# Patient Record
Sex: Female | Born: 1990 | Race: White | Hispanic: No | Marital: Married | Smoking: Former smoker
Health system: Southern US, Community
[De-identification: ages and names within clinical notes are randomized; demographics above are authoritative.]

## PROBLEM LIST (undated history)

## (undated) ENCOUNTER — Inpatient Hospital Stay (HOSPITAL_COMMUNITY): Payer: Self-pay

## (undated) DIAGNOSIS — T8859XA Other complications of anesthesia, initial encounter: Secondary | ICD-10-CM

## (undated) DIAGNOSIS — J45909 Unspecified asthma, uncomplicated: Secondary | ICD-10-CM

## (undated) DIAGNOSIS — R55 Syncope and collapse: Secondary | ICD-10-CM

## (undated) DIAGNOSIS — O09299 Supervision of pregnancy with other poor reproductive or obstetric history, unspecified trimester: Secondary | ICD-10-CM

## (undated) DIAGNOSIS — R569 Unspecified convulsions: Secondary | ICD-10-CM

## (undated) HISTORY — DX: Other complications of anesthesia, initial encounter: T88.59XA

## (undated) HISTORY — PX: ORIF CLAVICULAR FRACTURE: SHX5055

## (undated) HISTORY — DX: Syncope and collapse: R55

---
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
## 2017-11-02 ENCOUNTER — Ambulatory Visit (HOSPITAL_COMMUNITY)
Admission: EM | Admit: 2017-11-02 | Discharge: 2017-11-02 | Disposition: A | Discharge status: Home / Self Care | Payer: BLUE CROSS/BLUE SHIELD | Attending: Family Medicine | Admitting: Family Medicine

## 2017-11-02 ENCOUNTER — Encounter (HOSPITAL_COMMUNITY): Payer: Self-pay

## 2017-11-02 ENCOUNTER — Other Ambulatory Visit: Payer: Self-pay

## 2017-11-02 DIAGNOSIS — L03113 Cellulitis of right upper limb: Secondary | ICD-10-CM | POA: Diagnosis not present

## 2017-11-02 DIAGNOSIS — W57XXXA Bitten or stung by nonvenomous insect and other nonvenomous arthropods, initial encounter: Secondary | ICD-10-CM

## 2017-11-02 DIAGNOSIS — L03119 Cellulitis of unspecified part of limb: Secondary | ICD-10-CM

## 2017-11-02 DIAGNOSIS — S40861A Insect bite (nonvenomous) of right upper arm, initial encounter: Secondary | ICD-10-CM

## 2017-11-02 DIAGNOSIS — R21 Rash and other nonspecific skin eruption: Secondary | ICD-10-CM

## 2017-11-02 DIAGNOSIS — B998 Other infectious disease: Secondary | ICD-10-CM | POA: Insufficient documentation

## 2017-11-02 DIAGNOSIS — R569 Unspecified convulsions: Secondary | ICD-10-CM | POA: Insufficient documentation

## 2017-11-02 DIAGNOSIS — S50361A Insect bite (nonvenomous) of right elbow, initial encounter: Secondary | ICD-10-CM | POA: Insufficient documentation

## 2017-11-02 DIAGNOSIS — M7989 Other specified soft tissue disorders: Secondary | ICD-10-CM | POA: Diagnosis present

## 2017-11-02 DIAGNOSIS — J45909 Unspecified asthma, uncomplicated: Secondary | ICD-10-CM | POA: Diagnosis not present

## 2017-11-02 DIAGNOSIS — L02413 Cutaneous abscess of right upper limb: Secondary | ICD-10-CM | POA: Diagnosis not present

## 2017-11-02 DIAGNOSIS — M25522 Pain in left elbow: Secondary | ICD-10-CM | POA: Diagnosis not present

## 2017-11-02 DIAGNOSIS — L02419 Cutaneous abscess of limb, unspecified: Secondary | ICD-10-CM

## 2017-11-02 DIAGNOSIS — F17210 Nicotine dependence, cigarettes, uncomplicated: Secondary | ICD-10-CM | POA: Diagnosis not present

## 2017-11-02 HISTORY — DX: Unspecified asthma, uncomplicated: J45.909

## 2017-11-02 HISTORY — DX: Unspecified convulsions: R56.9

## 2017-11-02 MED ORDER — AMOXICILLIN 500 MG PO CAPS: 1000.0000 mg | ORAL_CAPSULE | Freq: Two times a day (BID) | ORAL | 0 refills | Status: DC

## 2017-11-02 MED ORDER — HYDROCODONE-ACETAMINOPHEN 5-325 MG PO TABS
1.0000 | ORAL_TABLET | Freq: Once | ORAL | Status: AC
  Administered 2017-11-02: 1 via ORAL

## 2017-11-02 MED ORDER — CEFTRIAXONE SODIUM 1 G IJ SOLR
1.0000 g | Freq: Once | INTRAMUSCULAR | Status: AC
  Administered 2017-11-02: 1 g via INTRAMUSCULAR

## 2017-11-02 MED ORDER — HYDROCODONE-ACETAMINOPHEN 5-325 MG PO TABS: 1.0000 | ORAL_TABLET | Freq: Four times a day (QID) | ORAL | 0 refills | Status: DC | PRN

## 2017-11-02 MED ORDER — HYDROCODONE-ACETAMINOPHEN 5-325 MG PO TABS
ORAL_TABLET | ORAL | Status: AC
  Filled 2017-11-02: qty 1

## 2017-11-02 MED ORDER — LIDOCAINE HCL (PF) 1 % IJ SOLN
INTRAMUSCULAR | Status: AC
  Filled 2017-11-02: qty 2

## 2017-11-02 MED ORDER — CEFTRIAXONE SODIUM 1 G IJ SOLR
INTRAMUSCULAR | Status: AC
  Filled 2017-11-02: qty 10

## 2017-11-02 MED ORDER — DOXYCYCLINE HYCLATE 100 MG PO CAPS: 100.0000 mg | ORAL_CAPSULE | Freq: Two times a day (BID) | ORAL | 0 refills | Status: AC

## 2017-11-02 NOTE — Discharge Instructions (Signed)
Take medication as prescribed.  Follow up with a health care provider in two days.  If you get worse then please go to the hospital.

## 2017-11-02 NOTE — ED Triage Notes (Signed)
Patient presents to [facility] for possible insect bite on left arm x5 days, pt's arm is red and swollen, pt has been taking OTC medications to treat pain but has no relief

## 2017-11-02 NOTE — ED Provider Notes (Signed)
11/02/2017 7:07 PM   DOB: [birth year] / MRN: [id number]  SUBJECTIVE:  Autumn Stephenson is a 27 y.o. female presenting for insect bite about the right elbow that is now red and inflamed. She complains of exquisite pain about the right elbow and complains of feeling feverish. Last TD was three years ago.   She has No Known Allergies.   She  has a past medical history of Asthma and Seizures (HCC).    She  reports that she has been smoking.  She has a 5.00 pack-year smoking history. She has never used smokeless tobacco. She reports that she drinks about 3.6 oz of alcohol per week. She reports that she does not use drugs. She  reports that she currently engages in sexual activity. The patient  has no past surgical history on file.  Her family history is not on file.  Review of Systems  Constitutional: Positive for fever. Negative for chills and diaphoresis.  Gastrointestinal: Negative for nausea.  Musculoskeletal: Positive for joint pain.  Skin: Positive for rash.  Neurological: Negative for dizziness.    OBJECTIVE:  BP 115/85 (BP Location: Right Arm)   Pulse (!) 101   Temp 99 F (37.2 C) (Oral)   Resp 18   LMP 10/28/2017 (Exact Date)   SpO2 100%   Wt Readings from Last 3 Encounters:  No data found for Wt   Temp Readings from Last 3 Encounters:  11/02/17 99 F (37.2 C) (Oral)   BP Readings from Last 3 Encounters:  11/02/17 115/85   Pulse Readings from Last 3 Encounters:  11/02/17 (!) 101    Physical Exam  Constitutional: She is oriented to person, place, and time. She appears well-nourished. No distress.  Eyes: Pupils are equal, round, and reactive to light. EOM are normal.  Cardiovascular: Regular rhythm.  Pulmonary/Chest: Effort normal.  Abdominal: She exhibits no distension.  Musculoskeletal:       Arms: Neurological: She is alert and oriented to person, place, and time. No cranial nerve deficit. Gait normal.  Skin: Skin is dry. She is not diaphoretic.  Psychiatric: She  has a normal mood and affect.  Vitals reviewed.  Risk and benefits discussed and verbal consent obtained. Anesthetic allergies reviewed. Patient anesthetized using 1:1 mix of 2% lidocaine with epi. A 1 cm incision was made using a number 11 blade and purulent material was expressed.  The was wound packed. The patient tolerated the procedure without difficulty.   A clean dressing was placed and wound care instructions were provided.    No results found for this or any previous visit (from the past 72 hour(s)).  No results found.  ASSESSMENT AND PLAN:   Cellulitis and abscess of upper extremity: Drained here.  The infection looks as if it is starting to take off.  I am giving her an IM dose of ceftiraxone and starting her on amox and doxy. Call pull off one when the culture results. Strict ED precautions provided.     Discharge Instructions     Take medication as prescribed.  Follow up with a health care provider in two days.  If you get worse then please go to the hospital.         The patient is advised to call or return to clinic if she does not see an improvement in symptoms, or to seek the care of the closest emergency department if she worsens with the above plan.   Deliah Boston, MHS, PA-C 11/02/2017 7:07 PM   Deliah Boston  L, PA-C 11/02/17 1910

## 2017-11-05 LAB — AEROBIC CULTURE W GRAM STAIN (SUPERFICIAL SPECIMEN): Special Requests: NORMAL

## 2017-11-05 LAB — AEROBIC CULTURE  (SUPERFICIAL SPECIMEN)

## 2017-11-08 ENCOUNTER — Telehealth (HOSPITAL_COMMUNITY): Payer: Self-pay

## 2017-11-08 NOTE — Telephone Encounter (Signed)
Culture results given to Dr. Georgia Duff, per his instruction patient called and instructed to stop Doxycycline and to keep the area clean and dry.  Pt reports feeling better and following up with her PCP

## 2017-11-19 NOTE — Congregational Nurse Program (Signed)
Congregational Nurse Program Note  Date of Encounter: 11/06/2017  Past Medical History: Past Medical History:  Diagnosis Date  . Asthma   . Seizures [facility](HCC)     Encounter Details: CNP Questionnaire - 11/12/17 0928      Questionnaire   Patient Status  Not Applicable    Race  White or Caucasian    Location Patient Served At  [facility]    Insurance  Not Applicable    Uninsured  Uninsured (NEW 1x/quarter)    Food  No food insecurities    Housing/Utilities  Yes, have permanent housing    Transportation  No transportation needs    Interpersonal Safety  Yes, feel physically and emotionally safe where you currently live    Medication  Yes, have medication insecurities    Medical Provider  No    Referrals  Area Agency    ED Visit Averted  Not Applicable    Life-Saving Intervention Made  Not Applicable      [facility] - supplies provided

## 2017-11-27 NOTE — Congregational Nurse Program (Signed)
Congregational Nurse Program Note  Date of Encounter: 11/26/2017  Past Medical History: Past Medical History:  Diagnosis Date  . Asthma   . Seizures (HCC)     Encounter Details: CNP Questionnaire - 11/26/17 1309      Questionnaire   Patient Status  Not Applicable    Race  White or Caucasian    [facility]    Insurance  Private Insurance    Uninsured  Not Applicable    Food  No food insecurities    Housing/Utilities  Yes, have permanent housing    Transportation  No transportation needs    Interpersonal Safety  Yes, feel physically and emotionally safe where you currently live    Medication  Yes, have medication insecurities    Medical Provider  No    Referrals  Area Agency    ED Visit Averted  Not Applicable    Life-Saving Intervention Made  Not Applicable      Discussed substance abuse treatment options

## 2017-12-14 NOTE — Congregational Nurse Program (Signed)
Congregational Nurse Program Note  Date of Encounter: 12/11/2017  Past Medical History: Past Medical History:  Diagnosis Date  . Asthma   . Seizures [facility](HCC)     Encounter Details: CNP Questionnaire - 12/11/17 1519      Questionnaire   Patient Status  Not Applicable    Race  White or Caucasian    [facility]    Insurance  Private Insurance    Uninsured  Not Applicable    Food  No food insecurities    Housing/Utilities  Yes, have permanent housing    Transportation  No transportation needs    Interpersonal Safety  Yes, feel physically and emotionally safe where you currently live    Medication  Yes, have medication insecurities    Medical Provider  No    Referrals  Area Agency    ED Visit Averted  Not Applicable    Life-Saving Intervention Made  Not Applicable      Client interested in treatment options for substance abuse.  Assisted client in researching options

## 2018-01-21 ENCOUNTER — Encounter: Payer: BLUE CROSS/BLUE SHIELD | Attending: Nurse Practitioner | Admitting: Nurse Practitioner

## 2018-01-21 DIAGNOSIS — L97111 Non-pressure chronic ulcer of right thigh limited to breakdown of skin: Secondary | ICD-10-CM | POA: Insufficient documentation

## 2018-01-21 DIAGNOSIS — L02415 Cutaneous abscess of right lower limb: Secondary | ICD-10-CM | POA: Insufficient documentation

## 2018-01-28 ENCOUNTER — Encounter: Payer: BLUE CROSS/BLUE SHIELD | Admitting: Nurse Practitioner

## 2018-01-28 DIAGNOSIS — L97111 Non-pressure chronic ulcer of right thigh limited to breakdown of skin: Secondary | ICD-10-CM | POA: Diagnosis not present

## 2018-01-31 NOTE — Progress Notes (Signed)
Autumn Stephenson, Divina ([id number]) Visit Report for 01/21/2018 Abuse/Suicide Risk Screen Details Patient Name: Autumn Stephenson, Autumn Stephenson Date of Service: 01/21/2018 9:45 AM Medical Record Number: [id number] Patient Account Number: [id number] Date of Birth/Sex: [birth year] (27 y.o. F) Treating RN: Renne CriglerFlinchum, Cheryl Primary Care Ferrel Simington: SYSTEM, Maurion Walkowiak Other Clinician: Referring Ramon Brant: Elray BubaWASHO, MICHAEL Treating Jenny Lai/Extender: Kathreen Cosieroulter, Leah Weeks in Treatment: 0 Abuse/Suicide Risk Screen Items Answer ABUSE/SUICIDE RISK SCREEN: Has anyone close to you tried to hurt or harm you recentlyo No Do you feel uncomfortable with anyone in your familyo No Has anyone forced you do things that you didnot want to doo No Patient displays signs or symptoms of abuse and/or neglect. No Electronic Signature(s) Signed: 01/21/2018 1:45:59 PM By: Renne CriglerFlinchum, Cheryl Entered By: Renne CriglerFlinchum, Cheryl on 01/21/2018 10:21:41 Autumn Stephenson, Autumn Stephenson ([id number]) -------------------------------------------------------------------------------- Activities of Daily Living Details Patient Name: Autumn Stephenson, Autumn Stephenson Date of Service: 01/21/2018 9:45 AM Medical Record Number: [id number] Patient Account Number: [id number] Date of Birth/Sex: [birth year] (27 y.o. F) Treating RN: Renne CriglerFlinchum, Cheryl Primary Care Janete Quilling: SYSTEM, Lala Been Other Clinician: Referring Jerrico Covello: Elray BubaWASHO, MICHAEL Treating Hetty Linhart/Extender: Kathreen Cosieroulter, Leah Weeks in Treatment: 0 Activities of Daily Living Items Answer Activities of Daily Living (Please select one for each item) Drive Automobile Completely Able Take Medications Completely Able Use Telephone Completely Able Care for Appearance Completely Able Use Toilet Completely Able Bath / Shower Completely Able Dress Self Completely Able Feed Self Completely Able Walk Completely Able Get In / Out Bed Completely Able Housework Completely Able Prepare Meals Completely Able Handle Money Completely Able Shop for Self Completely Able Electronic  Signature(s) Signed: 01/21/2018 1:45:59 PM By: Renne CriglerFlinchum, Cheryl Entered By: Renne CriglerFlinchum, Cheryl on 01/21/2018 10:22:01 Autumn Stephenson, Adriona ([id number]) -------------------------------------------------------------------------------- Education Assessment Details Patient Name: Autumn Stephenson, Autumn Stephenson Date of Service: 01/21/2018 9:45 AM Medical Record Number: [id number] Patient Account Number: [id number] Date of Birth/Sex: [birth year] (27 y.o. F) Treating RN: Renne CriglerFlinchum, Cheryl Primary Care Neal Oshea: SYSTEM, Victoria Euceda Other Clinician: Referring Rylee Nuzum: Elray BubaWASHO, MICHAEL Treating Anushree Dorsi/Extender: Kathreen Cosieroulter, Leah Weeks in Treatment: 0 Primary Learner Assessed: Patient Learning Preferences/Education Level/Primary Language Learning Preference: Explanation Highest Education Level: College or Above Preferred Language: English Cognitive Barrier Assessment/Beliefs Language Barrier: No Translator Needed: No Memory Deficit: No Emotional Barrier: No Cultural/Religious Beliefs Affecting Medical Care: No Physical Barrier Assessment Impaired Vision: No Impaired Hearing: No Decreased Hand dexterity: No Knowledge/Comprehension Assessment Knowledge Level: High Comprehension Level: High Ability to understand written High instructions: Ability to understand verbal High instructions: Motivation Assessment Anxiety Level: Calm Cooperation: Cooperative Education Importance: Acknowledges Need Interest in Health Problems: Asks Questions Perception: Coherent Willingness to Engage in Self- High Management Activities: Readiness to Engage in Self- High Management Activities: Electronic Signature(s) Signed: 01/21/2018 1:45:59 PM By: Renne CriglerFlinchum, Cheryl Entered By: Renne CriglerFlinchum, Cheryl on 01/21/2018 10:22:28 Autumn Stephenson, Autumn Stephenson ([id number]) -------------------------------------------------------------------------------- Fall Risk Assessment Details Patient Name: Autumn Stephenson, [name] Date of Service: 01/21/2018 9:45 AM Medical Record Number:  [id number] Patient Account Number: [id number] Date of Birth/Sex: [birth year] (27 y.o. F) Treating RN: Renne CriglerFlinchum, Cheryl Primary Care Reesha Debes: SYSTEM, Calixto Pavel Other Clinician: Referring Akram Kissick: Elray BubaWASHO, MICHAEL Treating Jessilynn Taft/Extender: Kathreen Cosieroulter, Leah Weeks in Treatment: 0 Fall Risk Assessment Items Have you had 2 or more falls in the last 12 monthso 0 No Have you had any fall that resulted in injury in the last 12 monthso 0 No FALL RISK ASSESSMENT: History of falling - immediate or within 3 months 0 No Secondary diagnosis 0 No Ambulatory aid None/bed rest/wheelchair/nurse 0 No Crutches/cane/walker 0 No Furniture 0 No IV Access/Saline Lock 0 No Gait/Training Normal/bed rest/immobile 0 No Weak 0 No Impaired 0 No Mental Status Oriented to  own ability 0 No Electronic Signature(s) Signed: 01/21/2018 1:45:59 PM By: Renne CriglerFlinchum, Cheryl Entered By: Renne CriglerFlinchum, Cheryl on 01/21/2018 10:22:34 Autumn Stephenson, Bhavya ([id number]) -------------------------------------------------------------------------------- Foot Assessment Details Patient Name: Autumn Stephenson, Autumn Stephenson Date of Service: 01/21/2018 9:45 AM Medical Record Number: [id number] Patient Account Number: [id number] Date of Birth/Sex: [birth year] (27 y.o. F) Treating RN: Renne CriglerFlinchum, Cheryl Primary Care Nani Ingram: SYSTEM, Kaytee Taliercio Other Clinician: Referring Sten Dematteo: Elray BubaWASHO, MICHAEL Treating Jaelee Laughter/Extender: Kathreen Cosieroulter, Leah Weeks in Treatment: 0 Foot Assessment Items Site Locations + = Sensation present, - = Sensation absent, C = Callus, U = Ulcer R = Redness, W = Warmth, M = Maceration, PU = Pre-ulcerative lesion F = Fissure, S = Swelling, D = Dryness Assessment Right: Left: Other Deformity: No No Prior Foot Ulcer: No No Prior Amputation: No No Charcot Joint: No No Ambulatory Status: Ambulatory Without Help Gait: Steady Electronic Signature(s) Signed: 01/21/2018 1:45:59 PM By: Renne CriglerFlinchum, Cheryl Entered By: Renne CriglerFlinchum, Cheryl on 01/21/2018 10:24:15 Autumn Stephenson, Cayleen  ([id number]) -------------------------------------------------------------------------------- Nutrition Risk Assessment Details Patient Name: Autumn Stephenson, Rashaun Date of Service: 01/21/2018 9:45 AM Medical Record Number: [id number] Patient Account Number: [id number] Date of Birth/Sex: [birth year] (27 y.o. F) Treating RN: Renne CriglerFlinchum, Cheryl Primary Care Saira Kramme: SYSTEM, Asha Grumbine Other Clinician: Referring Pavle Wiler: Elray BubaWASHO, MICHAEL Treating Kamen Hanken/Extender: Kathreen Cosieroulter, Leah Weeks in Treatment: 0 Height (in): 63 Weight (lbs): 117 Body Mass Index (BMI): 20.7 Nutrition Risk Assessment Items NUTRITION RISK SCREEN: I have an illness or condition that made me change the kind and/or amount of 0 No food I eat I eat fewer than two meals per day 0 No I eat few fruits and vegetables, or milk products 0 No I have three or more drinks of beer, liquor or wine almost every day 0 No I have tooth or mouth problems that make it hard for me to eat 0 No I don't always have enough money to buy the food I need 0 No I eat alone most of the time 0 No I take three or more different prescribed or over-the-counter drugs a day 0 No Without wanting to, I have lost or gained 10 pounds in the last six months 0 No I am not always physically able to shop, cook and/or feed myself 0 No Nutrition Protocols Good Risk Protocol 0 No interventions needed Moderate Risk Protocol Electronic Signature(s) Signed: 01/21/2018 1:45:59 PM By: Renne CriglerFlinchum, Cheryl Entered By: Renne CriglerFlinchum, Cheryl on 01/21/2018 10:23:01

## 2018-02-01 NOTE — Progress Notes (Signed)
Autumn Stephenson, Autumn Stephenson ([id number]) Visit Report for 01/21/2018 Chief Complaint Document Details Patient Name: Autumn Stephenson, Autumn Stephenson Date of Service: 01/21/2018 9:45 AM Medical Record Number: [id number] Patient Account Number: [id number] Date of Birth/Sex: Feb 20, 1991 (26 y.o. F) Treating RN: Huel CoventryWoody, Kim Primary Care Provider: SYSTEM, PROVIDER Other Clinician: Referring Provider: Elray BubaWASHO, MICHAEL Treating Provider/Extender: Kathreen Cosieroulter, [name] Weeks in Treatment: 0 Information Obtained from: Patient Chief Complaint right thigh wound Electronic Signature(s) Signed: 01/21/2018 10:36:48 AM By: Bonnell Publicoulter, [name] Entered By: Bonnell Publicoulter, [name] on 01/21/2018 10:36:48 Autumn Stephenson, Autumn Stephenson ([id number]) -------------------------------------------------------------------------------- HPI Details Patient Name: Autumn Stephenson, Autumn Stephenson Date of Service: 01/21/2018 9:45 AM Medical Record Number: [id number] Patient Account Number: [id number] Date of Birth/Sex: Feb 20, 1991 (26 y.o. F) Treating RN: Huel CoventryWoody, Kim Primary Care Provider: SYSTEM, PROVIDER Other Clinician: Referring Provider: Elray BubaWASHO, MICHAEL Treating Provider/Extender: Kathreen Cosieroulter, [name] Weeks in Treatment: 0 History of Present Illness HPI Description: 01/21/18-She is here initial evaluation for a right lateral thigh wound. She states she had an abscess, was using warm compress and it spontaneously ruptured 3-4 weeks ago. She has currently been applying mupirocin on daily. She has had 2 separate cycles of seven-day courses of Augmentin and doxycycline. There is no evidence of infection at today's visit. We will initiate hydrofera blue and she will follow up next week. She is voicing no c/o pain Electronic Signature(s) Signed: 01/21/2018 10:44:43 AM By: Bonnell Publicoulter, [name] Previous Signature: 01/21/2018 10:43:39 AM Version By: Bonnell Publicoulter, [name] Entered By: Bonnell Publicoulter, [name] on 01/21/2018 10:44:43 Autumn Stephenson, Autumn Stephenson ([id number]) -------------------------------------------------------------------------------- Physical Exam Details Patient  Name: Autumn Stephenson, Autumn Stephenson Date of Service: 01/21/2018 9:45 AM Medical Record Number: [id number] Patient Account Number: [id number] Date of Birth/Sex: Feb 20, 1991 (26 y.o. F) Treating RN: Huel CoventryWoody, Kim Primary Care Provider: SYSTEM, PROVIDER Other Clinician: Referring Provider: Elray BubaWASHO, MICHAEL Treating Provider/Extender: Kathreen Cosieroulter, [name] Weeks in Treatment: 0 Respiratory respirations are even and unlabored. clear throughout. Cardiovascular s1 s2 regular rate and rhythm. Musculoskeletal ambulates with no assistive devices. Psychiatric appears to have appropriate insight and judgement to medical care. oriented x4. calm, pleasant, conversive. Electronic Signature(s) Signed: 01/21/2018 10:45:37 AM By: Bonnell Publicoulter, [name] Entered By: Bonnell Publicoulter, [name] on 01/21/2018 10:45:36 Autumn Stephenson, Autumn Stephenson ([id number]) -------------------------------------------------------------------------------- Physician Orders Details Patient Name: Autumn Stephenson, Autumn Stephenson Date of Service: 01/21/2018 9:45 AM Medical Record Number: [id number] Patient Account Number: [id number] Date of Birth/Sex: Feb 20, 1991 (26 y.o. 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 Primary Wound Dressing Wound #1 Right,Lateral Upper Leg o Hydrafera Blue Ready Transfer Secondary Dressing Wound #1 Right,Lateral Upper Leg o [place] Dressing Change Frequency Wound #1 Right,Lateral Upper Leg o Change Dressing Monday, Wednesday,  Friday Follow-up Appointments Wound #1 Right,Lateral Upper Leg o Return Appointment in 1 week. Electronic Signature(s) Signed: 01/21/2018 4:31:15 PM By: Bonnell Publicoulter, [name] Signed: 01/21/2018 5:11:54 PM By: Elliot GurneyWoody, BSN, RN, CWS, [name], BSN Entered By: Elliot GurneyWoody, BSN, RN, CWS, Kim on 01/21/2018 10:41:14 Autumn Stephenson, Autumn Stephenson ([id number]) -------------------------------------------------------------------------------- Problem List Details Patient Name: Autumn Stephenson, Autumn Stephenson Date of Service: 01/21/2018 9:45 AM Medical Record Number: [id number] Patient Account Number: [id number] Date of Birth/Sex: Feb 20, 1991 (26 y.o. 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 01/21/2018 No Yes of skin L02.415 Cutaneous abscess of right lower limb 01/21/2018 No Yes Inactive Problems Resolved Problems Electronic Signature(s) Signed: 01/21/2018 10:44:02 AM By: Bonnell Publicoulter, [name] Previous Signature: 01/21/2018 10:36:01 AM Version By: Bonnell Publicoulter, [name] Entered By: Bonnell Publicoulter, Mikiah Durall on 01/21/2018 10:44:01 Autumn Stephenson, Autumn Stephenson ([id number]) -------------------------------------------------------------------------------- Progress Note Details Patient Name: Autumn Stephenson, Autumn Stephenson Date of Service: 01/21/2018 9:45 AM Medical Record Number: [id number] Patient Account Number: [id number] Date of Birth/Sex: 1991/02/10 (26 y.o. F) Treating RN: Huel CoventryWoody, Kim Primary Care Provider: SYSTEM, PROVIDER Other Clinician: Referring Provider: Elray BubaWASHO, MICHAEL Treating Provider/Extender: Kathreen Cosieroulter, Dametrius Sanjuan Weeks in Treatment: 0 Subjective Chief Complaint Information obtained from Patient right thigh wound History of Present Illness (HPI) 01/21/18-She is here initial evaluation for a right lateral thigh wound.  She states she had an abscess, was using warm compress and it spontaneously ruptured 3-4 weeks ago. She has currently been applying mupirocin on daily. She has had 2 separate cycles of seven-day courses of Augmentin and doxycycline. There is no evidence of infection at today's visit. We will initiate hydrofera blue and she will follow up next week. She is voicing no c/o pain Wound History Patient presents with 1 open wound that has been present for approximately 1 .5 months. Patient has been treating wound in the following manner: mupericon. Laboratory tests have not been performed in the last month. Patient reportedly has not tested positive for an antibiotic resistant organism. Patient reportedly has not tested positive for osteomyelitis. Patient reportedly has not had testing performed to evaluate circulation in the legs. Patient experiences the following problems associated with their wounds: infection. Patient History Information obtained from Patient. Allergies No Known Drug Allergies Family History Cancer - Mother,Maternal Grandparents, Diabetes - Father, No family history of Heart Disease, Hypertension, Kidney Disease, Lung Disease, Seizures, Stroke, Thyroid Problems, Tuberculosis. Social History Current every day smoker, Marital Status - Single, Alcohol Use - Never, Drug Use - Prior History, Caffeine Use - Daily. Medical History Eyes Denies history of Cataracts, Glaucoma, Optic Neuritis Ear/Nose/Mouth/Throat Denies history of Chronic sinus problems/congestion, Middle ear problems Hematologic/Lymphatic Denies history of Anemia, Hemophilia, Human Immunodeficiency Virus, Lymphedema, Sickle Cell Disease Respiratory Patient has history of Asthma Denies history of Aspiration, Chronic Obstructive Pulmonary Disease (COPD), Pneumothorax, Sleep Apnea Cardiovascular Denies history of Angina, Arrhythmia, Congestive Heart Failure, Coronary Artery Disease, Deep Vein  Thrombosis, Hypertension, Hypotension, Myocardial Infarction, Peripheral Arterial Disease, Peripheral Venous Disease, Phlebitis, MASON, Maurice MarchLANE ([id number]) Vasculitis Gastrointestinal Denies history of Cirrhosis , Colitis, Crohn s, Hepatitis A, Hepatitis B, Hepatitis C Endocrine Denies history of Type I Diabetes, Type II Diabetes Genitourinary Denies history of End Stage Renal Disease Immunological Denies history of Lupus Erythematosus, Raynaud s, Scleroderma Integumentary (Skin) Denies history of History of Burn, History of pressure wounds Musculoskeletal Denies history of Gout, Rheumatoid Arthritis, Osteoarthritis, Osteomyelitis Neurologic Denies history of Dementia, Neuropathy, Quadriplegia, Paraplegia, Seizure Disorder Medical And Surgical History Notes Ear/Nose/Mouth/Throat deviated septum Review of Systems (ROS) Constitutional Symptoms (General Health) Denies complaints or symptoms of Fatigue, Fever, Chills, Marked Weight Change. Eyes Denies complaints or symptoms of Dry Eyes, Vision Changes, Glasses / Contacts. Ear/Nose/Mouth/Throat Denies complaints or symptoms of Difficult clearing ears, Sinusitis. Hematologic/Lymphatic Denies complaints or symptoms of Bleeding / Clotting Disorders, Human Immunodeficiency Virus. Respiratory Denies complaints or symptoms of Chronic or frequent coughs, Shortness of Breath. Cardiovascular Denies complaints or symptoms of Chest pain, LE  edema. Gastrointestinal Denies complaints or symptoms of Frequent diarrhea, Nausea, Vomiting, gastritis Endocrine Complains or has symptoms of Thyroid disease - hyperactive. Denies complaints or symptoms of Hepatitis, Polydypsia (Excessive Thirst). Genitourinary Denies complaints or symptoms of Kidney failure/ Dialysis, Incontinence/dribbling, kidney low funtion Immunological Denies complaints or symptoms of Hives, Itching. Integumentary (Skin) Complains or has symptoms of Wounds, Bleeding or bruising  tendency. Denies complaints or symptoms of Breakdown, Swelling. Musculoskeletal Denies complaints or symptoms of Muscle Pain, Muscle Weakness. Neurologic Denies complaints or symptoms of Numbness/parasthesias, Focal/Weakness. Oncologic The patient has no complaints or symptoms. LAIANA, FRATUS ([id number]) Objective Constitutional Vitals Time Taken: 10:13 AM, Height: 63 in, Source: Stated, Weight: 117 lbs, Source: Measured, BMI: 20.7, Temperature: 98.6 F, Pulse: 95 bpm, Respiratory Rate: 18 breaths/min, Blood Pressure: 114/82 mmHg. Respiratory respirations are even and unlabored. clear throughout. Cardiovascular s1 s2 regular rate and rhythm. Musculoskeletal ambulates with no assistive devices. Psychiatric appears to have appropriate insight and judgement to medical care. oriented x4. calm, pleasant, conversive. Integumentary (Hair, Skin) Wound #1 status is Open. Original cause of wound was Gradually Appeared. The wound is located on the Right,Lateral Upper Leg. The wound measures 1.5cm length x 1.3cm width x 0.2cm depth; 1.532cm^2 area and 0.306cm^3 volume. There is Fat Layer (Subcutaneous Tissue) Exposed exposed. There is no undermining noted, however, there is tunneling at 2:00 with a maximum distance of 0.2cm. There is a large amount of serous drainage noted. The wound margin is distinct with the outline attached to the wound base. There is large (67-100%) red, pink granulation within the wound bed. There is a small (1-33%) amount of necrotic tissue within the wound bed. The periwound skin appearance exhibited: Scarring. The periwound skin appearance did not exhibit: Callus, Crepitus, Excoriation, Induration, Rash, Dry/Scaly, Maceration, Atrophie Blanche, Cyanosis, Ecchymosis, Hemosiderin Staining, Mottled, Pallor, Rubor, Erythema. Periwound temperature was noted as No Abnormality. The periwound has tenderness on palpation. Assessment Active Problems ICD-10 Non-pressure chronic  ulcer of right thigh limited to breakdown of skin Cutaneous abscess of right lower limb Plan Wound Cleansing: Wound #1 Right,Lateral Upper Leg: Cleanse wound with mild soap and water Anesthetic (add to Medication List): Topical Lidocaine 4% cream applied to wound bed prior to debridement (In Clinic Only). Primary Wound Dressing: Wound #1 Right,Lateral Upper Leg: CAEDYN, TASSINARI ([id number]) Hydrafera Blue Ready Transfer Secondary Dressing: Wound #1 Right,Lateral Upper Leg: [place] Dressing Change Frequency: Wound #1 Right,Lateral Upper Leg: Change Dressing Monday, Wednesday, Friday Follow-up Appointments: Wound #1 Right,Lateral Upper Leg: Return Appointment in 1 week. Electronic Signature(s) Signed: 01/21/2018 11:56:59 AM By: Bonnell Public Entered By: Bonnell Public on 01/21/2018 11:56:59 Autumn Stephenson ([id number]) -------------------------------------------------------------------------------- ROS/PFSH Details Patient Name: Autumn Stephenson Date of Service: 01/21/2018 9:45 AM Medical Record Number: [id number] Patient Account Number: [id number] Date of Birth/Sex: 1991-06-01 (26 y.o. F) Treating RN: Renne Crigler Primary Care Provider: SYSTEM, PROVIDER Other Clinician: Referring Provider: Elray Buba Treating Provider/Extender: Kathreen Cosier in Treatment: 0 Information Obtained From Patient Wound History Do you currently have one or more open woundso Yes How many open wounds do you currently haveo 1 Approximately how long have you had your woundso 1 .5 months How have you been treating your wound(s) until nowo mupericon Has your wound(s) ever healed and then re-openedo No Have you had any lab work done in the past montho No Have you tested positive for an antibiotic resistant organism (MRSA, VRE)o No Have you tested positive for osteomyelitis (bone infection)o No Have you had any tests for circulation on your legso No Have  you had other problems associated with your  woundso Infection Constitutional Symptoms (General Health) Complaints and Symptoms: Negative for: Fatigue; Fever; Chills; Marked Weight Change Eyes Complaints and Symptoms: Negative for: Dry Eyes; Vision Changes; Glasses / Contacts Medical History: Negative for: Cataracts; Glaucoma; Optic Neuritis Ear/Nose/Mouth/Throat Complaints and Symptoms: Negative for: Difficult clearing ears; Sinusitis Medical History: Negative for: Chronic sinus problems/congestion; Middle ear problems Past Medical History Notes: deviated septum Hematologic/Lymphatic Complaints and Symptoms: Negative for: Bleeding / Clotting Disorders; Human Immunodeficiency Virus Medical History: Negative for: Anemia; Hemophilia; Human Immunodeficiency Virus; Lymphedema; Sickle Cell Disease Respiratory Complaints and Symptoms: Negative for: Chronic or frequent coughs; Shortness of Breath BLESSINGS, IN[place]TT ([id number]) Medical History: Positive for: Asthma Negative for: Aspiration; Chronic Obstructive Pulmonary Disease (COPD); Pneumothorax; Sleep Apnea Cardiovascular Complaints and Symptoms: Negative for: Chest pain; LE edema Medical History: Negative for: Angina; Arrhythmia; Congestive Heart Failure; Coronary Artery Disease; Deep Vein Thrombosis; Hypertension; Hypotension; Myocardial Infarction; Peripheral Arterial Disease; Peripheral Venous Disease; Phlebitis; Vasculitis Gastrointestinal Complaints and Symptoms: Negative for: Frequent diarrhea; Nausea; Vomiting Review of System Notes: gastritis Medical History: Negative for: Cirrhosis ; Colitis; Crohnos; Hepatitis A; Hepatitis B; Hepatitis C Endocrine Complaints and Symptoms: Positive for: Thyroid disease - hyperactive Negative for: Hepatitis; Polydypsia (Excessive Thirst) Medical History: Negative for: Type I Diabetes; Type II Diabetes Genitourinary Complaints and Symptoms: Negative for: Kidney failure/ Dialysis; Incontinence/dribbling Review of System  Notes: kidney low funtion Medical History: Negative for: End Stage Renal Disease Immunological Complaints and Symptoms: Negative for: Hives; Itching Medical History: Negative for: Lupus Erythematosus; Raynaudos; Scleroderma Integumentary (Skin) Complaints and Symptoms: Positive for: Wounds; Bleeding or bruising tendency Negative for: Breakdown; Swelling Medical History: Negative for: History of Burn; History of pressure wounds JAMILYA, SARRAZIN ([id number]) Musculoskeletal Complaints and Symptoms: Negative for: Muscle Pain; Muscle Weakness Medical History: Negative for: Gout; Rheumatoid Arthritis; Osteoarthritis; Osteomyelitis Neurologic Complaints and Symptoms: Negative for: Numbness/parasthesias; Focal/Weakness Medical History: Negative for: Dementia; Neuropathy; Quadriplegia; Paraplegia; Seizure Disorder Oncologic Complaints and Symptoms: No Complaints or Symptoms Immunizations Pneumococcal Vaccine: Received Pneumococcal Vaccination: Yes Implantable Devices Family and Social History Cancer: Yes - Mother,Maternal Grandparents; Diabetes: Yes - Father; Heart Disease: No; Hypertension: No; Kidney Disease: No; Lung Disease: No; Seizures: No; Stroke: No; Thyroid Problems: No; Tuberculosis: No; Current every day smoker; Marital Status - Single; Alcohol Use: Never; Drug Use: Prior History; Caffeine Use: Daily; Financial Concerns: No; Food, Clothing or Shelter Needs: No; Support System Lacking: No; Transportation Concerns: No; Advanced Directives: No; Patient does not want information on Advanced Directives; Living Will: No; Medical Power of Attorney: No Electronic Signature(s) Signed: 01/21/2018 1:45:59 PM By: Renne Crigler Signed: 01/21/2018 4:31:15 PM By: Bonnell Public Entered By: Renne Crigler on 01/21/2018 10:21:21 LOUETTA, HOLLINGSHEAD ([id number]) -------------------------------------------------------------------------------- SuperBill Details Patient Name: Autumn Stephenson Date of  Service: 01/21/2018 Medical Record Number: [id number] Patient Account Number: [id number] Date of Birth/Sex: 01/15/1991 (26 y.o. 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

## 2018-02-01 NOTE — Progress Notes (Signed)
MISHAAL, LANSDALE ([id number]) Visit Report for 01/21/2018 Allergy List Details Patient Name: Autumn Stephenson, Autumn Stephenson Date of Service: 01/21/2018 9:45 AM Medical Record Number: [id number] Patient Account Number: [id number] Date of Birth/Sex: [birth year] (27 y.o. F) Treating RN: Renne Crigler Primary Care Kylor Valverde: SYSTEM, Niasia Lanphear Other Clinician: Referring Jayvin Hurrell: Elray Buba Treating Jori Frerichs/Extender: Bonnell Public Weeks in Treatment: 0 Allergies Active Allergies No Known Drug Allergies Allergy Notes Electronic Signature(s) Signed: 01/21/2018 1:45:59 PM By: Renne Crigler Entered By: Renne Crigler on 01/21/2018 10:14:29 Autumn Stephenson ([id number]) -------------------------------------------------------------------------------- Arrival Information Details Patient Name: Autumn Stephenson Date of Service: 01/21/2018 9:45 AM Medical Record Number: [id number] Patient Account Number: [id number] Date of Birth/Sex: [birth year] (27 y.o. F) Treating RN: Renne Crigler Primary Care Annelies Coyt: SYSTEM, Anna-Marie Coller Other Clinician: Referring Homer Miller: Elray Buba Treating Azizah Lisle/Extender: Kathreen Cosier in Treatment: 0 Visit Information Patient Arrived: Ambulatory Arrival Time: 10:10 Accompanied By: self Transfer Assistance: None Patient Identification Verified: Yes Secondary Verification Process Completed: Yes Electronic Signature(s) Signed: 01/21/2018 1:45:59 PM By: Renne Crigler Entered By: Renne Crigler on 01/21/2018 10:13:12 Autumn Stephenson ([id number]) -------------------------------------------------------------------------------- Clinic Level of Care Assessment Details Patient Name: Autumn Stephenson Date of Service: 01/21/2018 9:45 AM Medical Record Number: [id number] Patient Account Number: [id number] Date of Birth/Sex: [birth year] (27 y.o. F) Treating RN: Huel Coventry Primary Care Takyia Sindt: SYSTEM, Debbe Crumble Other Clinician: Referring Revere Maahs: Elray Buba Treating Eriq Hufford/Extender: Kathreen Cosier in Treatment: 0 Clinic Level of Care Assessment Items TOOL 2 Quantity Score []  - Use when only an EandM is performed on the INITIAL visit 0 ASSESSMENTS - Nursing Assessment / Reassessment X - General Physical Exam (combine w/ comprehensive assessment (listed just below) when 1 20 performed on new pt. evals) X- 1 25 Comprehensive Assessment (HX, ROS, Risk Assessments, Wounds Hx, etc.) ASSESSMENTS - Wound and Skin Assessment / Reassessment X - Simple Wound Assessment / Reassessment - one wound 1 5 []  - 0 Complex Wound Assessment / Reassessment - multiple wounds []  - 0 Dermatologic / Skin Assessment (not related to wound area) ASSESSMENTS - Ostomy and/or Continence Assessment and Care []  - Incontinence Assessment and Management 0 []  - 0 Ostomy Care Assessment and Management (repouching, etc.) PROCESS - Coordination of Care X - Simple Patient / Family Education for ongoing care 1 15 []  - 0 Complex (extensive) Patient / Family Education for ongoing care X- 1 10 Staff obtains Chiropractor, Records, Test Results / Process Orders []  - 0 Staff telephones HHA, Nursing Homes / Clarify orders / etc []  - 0 Routine Transfer to another Facility (non-emergent condition) []  - 0 Routine Hospital Admission (non-emergent condition) []  - 0 New Admissions / Manufacturing engineer / Ordering NPWT, Apligraf, etc. []  - 0 Emergency Hospital Admission (emergent condition) X- 1 10 Simple Discharge Coordination []  - 0 Complex (extensive) Discharge Coordination PROCESS - Special Needs []  - Pediatric / Minor Patient Management 0 []  - 0 Isolation Patient Management NAIMAH, YINGST ([id number]) []  - 0 Hearing / Language / Visual special needs []  - 0 Assessment of Community assistance (transportation, D/C planning, etc.) []  - 0 Additional assistance / Altered mentation []  - 0 Support Surface(s) Assessment (bed, cushion, seat, etc.) INTERVENTIONS - Wound Cleansing / Measurement X - Wound  Imaging (photographs - any number of wounds) 1 5 []  - 0 Wound Tracing (instead of photographs) X- 1 5 Simple Wound Measurement - one wound []  - 0 Complex Wound Measurement - multiple wounds X- 1 5 Simple Wound Cleansing - one wound []  - 0 Complex Wound Cleansing - multiple wounds INTERVENTIONS - Wound Dressings []  -  Small Wound Dressing one or multiple wounds 0 X- 1 15 Medium Wound Dressing one or multiple wounds []  - 0 Large Wound Dressing one or multiple wounds []  - 0 Application of Medications - injection INTERVENTIONS - Miscellaneous []  - External ear exam 0 []  - 0 Specimen Collection (cultures, biopsies, blood, body fluids, etc.) []  - 0 Specimen(s) / Culture(s) sent or taken to Lab for analysis []  - 0 Patient Transfer (multiple staff / Nurse, adultHoyer Lift / Similar devices) []  - 0 Simple Staple / Suture removal (25 or less) []  - 0 Complex Staple / Suture removal (26 or more) []  - 0 Hypo / Hyperglycemic Management (close monitor of Blood Glucose) []  - 0 Ankle / Brachial Index (ABI) - do not check if billed separately Has the patient been seen at the hospital within the last three years: Yes Total Score: 115 Level Of Care: New/Established - Level 3 Electronic Signature(s) Signed: 01/21/2018 5:11:54 PM By: Elliot GurneyWoody, BSN, RN, CWS, [name], BSN Entered By: Elliot GurneyWoody, BSN, RN, CWS, Kim on 01/21/2018 10:42:09 Autumn Stephenson, Autumn Stephenson ([id number]) -------------------------------------------------------------------------------- Encounter Discharge Information Details Patient Name: Autumn Stephenson, Autumn Stephenson Date of Service: 01/21/2018 9:45 AM Medical Record Number: [id number] Patient Account Number: [id number] Date of Birth/Sex: 03-22-1991 (27 y.o. F) Treating RN: Renne CriglerFlinchum, Cheryl Primary Care Artasia Thang: SYSTEM, Letonia Stead Other Clinician: Referring Sherilyn Windhorst: Elray BubaWASHO, MICHAEL Treating Arkel Cartwright/Extender: Kathreen Cosieroulter, Leah Weeks in Treatment: 0 Encounter Discharge Information Items Discharge Condition: Stable Ambulatory  Status: Ambulatory Discharge Destination: Home Transportation: Private Auto Schedule Follow-up Appointment: Yes Clinical Summary of Care: Electronic Signature(s) Signed: 01/21/2018 1:45:59 PM By: Renne CriglerFlinchum, Cheryl Entered By: Renne CriglerFlinchum, Cheryl on 01/21/2018 10:45:37 Autumn Stephenson, Autumn Stephenson ([id number]) -------------------------------------------------------------------------------- Lower Extremity Assessment Details Patient Name: Autumn Stephenson, Autumn Stephenson Date of Service: 01/21/2018 9:45 AM Medical Record Number: [id number] Patient Account Number: [id number] Date of Birth/Sex: 03-22-1991 (27 y.o. F) Treating RN: Renne CriglerFlinchum, Cheryl Primary Care Fleur Audino: SYSTEM, Macarthur Lorusso Other Clinician: Referring Meilani Edmundson: Elray BubaWASHO, MICHAEL Treating Lunah Losasso/Extender: Kathreen Cosieroulter, Leah Weeks in Treatment: 0 Edema Assessment Assessed: [Left: No] [Right: No] Edema: [Left: N] [Right: o] Vascular Assessment Claudication: Claudication Assessment [Right:None] Pulses: Dorsalis Pedis Palpable: [Right:Yes] Posterior Tibial Extremity colors, hair growth, and conditions: Extremity Color: [Right:Normal] Hair Growth on Extremity: [Right:Yes] Temperature of Extremity: [Right:Warm] Capillary Refill: [Right:< 3 seconds] Toe Nail Assessment Left: Right: Thick: No Discolored: No Deformed: No Improper Length and Hygiene: No Electronic Signature(s) Signed: 01/21/2018 1:45:59 PM By: Renne CriglerFlinchum, Cheryl Entered By: Renne CriglerFlinchum, Cheryl on 01/21/2018 10:30:00 Autumn Stephenson, Autumn Stephenson ([id number]) -------------------------------------------------------------------------------- Multi Wound Chart Details Patient Name: Autumn Stephenson, Autumn Stephenson Date of Service: 01/21/2018 9:45 AM Medical Record Number: [id number] Patient Account Number: [id number] Date of Birth/Sex: 03-22-1991 (27 y.o. F) Treating RN: Huel CoventryWoody, Kim Primary Care Jaylynn Mcaleer: SYSTEM, Julie Paolini Other Clinician: Referring Jahquez Steffler: Elray BubaWASHO, MICHAEL Treating Kadeidra Coryell/Extender: Kathreen Cosieroulter, Leah Weeks in Treatment: 0 Vital  Signs Height(in): 63 Pulse(bpm): 95 Weight(lbs): 117 Blood Pressure(mmHg): 114/82 Body Mass Index(BMI): 21 Temperature(F): 98.6 Respiratory Rate 18 (breaths/min): Photos: [1:No Photos] [N/A:N/A] Wound Location: [1:Right Upper Leg - Lateral] [N/A:N/A] Wounding Event: [1:Gradually Appeared] [N/A:N/A] Primary Etiology: [1:To be determined] [N/A:N/A] Comorbid History: [1:Asthma] [N/A:N/A] Date Acquired: [1:12/15/2017] [N/A:N/A] Weeks of Treatment: [1:0] [N/A:N/A] Wound Status: [1:Open] [N/A:N/A] Measurements L x W x D [1:1.5x1.3x0.2] [N/A:N/A] (cm) Area (cm) : [1:1.532] [N/A:N/A] Volume (cm) : [1:0.306] [N/A:N/A] Position 1 (o'clock): [1:2] Maximum Distance 1 (cm): [1:0.2] Tunneling: [1:Yes] [N/A:N/A] Classification: [1:Full Thickness Without Exposed Support Structures] [N/A:N/A] Exudate Amount: [1:Large] [N/A:N/A] Exudate Type: [1:Serous] [N/A:N/A] Exudate Color: [1:amber] [N/A:N/A] Wound Margin: [1:Distinct, outline attached] [N/A:N/A] Granulation Amount: [1:Large (67-100%)] [N/A:N/A] Granulation Quality: [1:Red, Pink] [N/A:N/A] Necrotic Amount: [1:Small (  1-33%)] [N/A:N/A] Exposed Structures: [1:Fat Layer (Subcutaneous Tissue) Exposed: Yes] [N/A:N/A] Epithelialization: [1:Small (1-33%)] [N/A:N/A] Periwound Skin Texture: [1:Scarring: Yes Excoriation: No Induration: No Callus: No Crepitus: No Rash: No] [N/A:N/A] Periwound Skin Moisture: [1:Maceration: No Dry/Scaly: No] [N/A:N/A] Periwound Skin Color: [1:Atrophie Blanche: No Cyanosis: No] [N/A:N/A] Ecchymosis: No Erythema: No Hemosiderin Staining: No Mottled: No Pallor: No Rubor: No Temperature: No Abnormality N/A N/A Tenderness on Palpation: Yes N/A N/A Wound Preparation: Ulcer Cleansing: N/A N/A Rinsed/Irrigated with Saline Topical Anesthetic Applied: Other: LIDOCAINE 4% Treatment Notes Electronic Signature(s) Signed: 01/21/2018 10:44:07 AM By: Bonnell Publicoulter, Leah Previous Signature: 01/21/2018 10:36:34 AM Version By:  Bonnell Publicoulter, Leah Entered By: Bonnell Publicoulter, Leah on 01/21/2018 10:44:07 Autumn Stephenson, Shigeko ([id number]) -------------------------------------------------------------------------------- Multi-Disciplinary Care Plan Details Patient Name: Autumn Stephenson, Autumn Stephenson Date of Service: 01/21/2018 9:45 AM Medical Record Number: [id number] Patient Account Number: [id number] Date of Birth/Sex: [birth year] (26 y.o. F) Treating RN: Huel CoventryWoody, Kim Primary Care Manford Sprong: SYSTEM, Chadric Kimberley Other Clinician: Referring Maury Groninger: Elray BubaWASHO, MICHAEL Treating Veniamin Kincaid/Extender: Kathreen Cosieroulter, Leah Weeks in Treatment: 0 Active Inactive ` Orientation to the Wound Care Program Nursing Diagnoses: Knowledge deficit related to the wound healing center program Goals: Patient/caregiver will verbalize understanding of the Wound Healing Center Program Date Initiated: 01/21/2018 Target Resolution Date: 02/13/2018 Goal Status: Active Interventions: Provide education on orientation to the wound center Notes: ` Soft Tissue Infection Nursing Diagnoses: Impaired tissue integrity Goals: Patient will remain free of wound infection Date Initiated: 01/21/2018 Target Resolution Date: 02/13/2018 Goal Status: Active Interventions: Provide education on infection Treatment Activities: Systemic antibiotics : 01/21/2018 Notes: ` Wound/Skin Impairment Nursing Diagnoses: Impaired tissue integrity Goals: Ulcer/skin breakdown will have a volume reduction of 30% by week 4 Date Initiated: 01/21/2018 Target Resolution Date: 02/21/2018 Autumn Stephenson, Autumn Stephenson ([id number]) Goal Status: Active Interventions: Assess ulceration(s) every visit Treatment Activities: Skin care regimen initiated : 01/21/2018 Notes: Electronic Signature(s) Signed: 01/21/2018 5:11:54 PM By: Elliot GurneyWoody, BSN, RN, CWS, [name], BSN Entered By: Elliot GurneyWoody, BSN, RN, CWS, Kim on 01/21/2018 10:39:08 Autumn Stephenson, Autumn Stephenson ([id number]) -------------------------------------------------------------------------------- Pain Assessment  Details Patient Name: Autumn Stephenson, Zelma Date of Service: 01/21/2018 9:45 AM Medical Record Number: [id number] Patient Account Number: [id number] Date of Birth/Sex: [birth year] (26 y.o. F) Treating RN: Renne CriglerFlinchum, Cheryl Primary Care Chantal Worthey: SYSTEM, Arianis Bowditch Other Clinician: Referring Kari Montero: Elray BubaWASHO, MICHAEL Treating Vail Basista/Extender: Kathreen Cosieroulter, Leah Weeks in Treatment: 0 Active Problems Location of Pain Severity and Description of Pain Patient Has Paino Yes Site Locations Pain Location: Pain in Ulcers Duration of the Pain. Constant / Intermittento Intermittent Rate the pain. Current Pain Level: 2 Pain Management and Medication Current Pain Management: Notes stings in mornings with showers and dressing changes Electronic Signature(s) Signed: 01/21/2018 1:45:59 PM By: Renne CriglerFlinchum, Cheryl Entered By: Renne CriglerFlinchum, Cheryl on 01/21/2018 10:13:42 Autumn Stephenson, Norva ([id number]) -------------------------------------------------------------------------------- Patient/Caregiver Education Details Patient Name: Autumn Stephenson, Aleda Date of Service: 01/21/2018 9:45 AM Medical Record Number: [id number] Patient Account Number: [id number] Date of Birth/Gender: [birth year] (26 y.o. F) Treating RN: Renne CriglerFlinchum, Cheryl Primary Care Physician: SYSTEM, Vonnie Ligman Other Clinician: Referring Physician: Elray BubaWASHO, MICHAEL Treating Physician/Extender: Kathreen Cosieroulter, Leah Weeks in Treatment: 0 Education Assessment Education Provided To: Patient Education Topics Provided Infection: Handouts: Infection Prevention and Management Methods: Explain/Verbal Responses: State content correctly Smoking and Wound Healing: Handouts: Smoking and Wound Healing Methods: Explain/Verbal Responses: State content correctly Welcome To The Wound Care Center: Handouts: Welcome To The [facility] Methods: Explain/Verbal Responses: State content correctly Wound/Skin Impairment: Methods: Explain/Verbal Responses: State content correctly Electronic  Signature(s) Signed: 01/21/2018 1:45:59 PM By: Renne CriglerFlinchum, Cheryl Entered By: Renne CriglerFlinchum, Cheryl on 01/21/2018 10:46:13 Autumn Stephenson, Perris ([id number]) -------------------------------------------------------------------------------- Wound Assessment Details Patient  Name: Autumn Stephenson Date of Service: 01/21/2018 9:45 AM Medical Record Number: [id number] Patient Account Number: [id number] Date of Birth/Sex: 02-05-1991 (26 y.o. F) Treating RN: Renne Crigler Primary Care Loris Seelye: SYSTEM, Chino Sardo Other Clinician: Referring Druscilla Petsch: Elray Buba Treating Corvin Sorbo/Extender: Kathreen Cosier in Treatment: 0 Wound Status Wound Number: 1 Primary Etiology: To be determined Wound Location: Right Upper Leg - Lateral Wound Status: Open Wounding Event: Gradually Appeared Comorbid History: Asthma Date Acquired: 12/15/2017 Weeks Of Treatment: 0 Clustered Wound: No Photos Photo Uploaded By: Renne Crigler on 01/21/2018 11:31:31 Wound Measurements Length: (cm) 1.5 Width: (cm) 1.3 Depth: (cm) 0.2 Area: (cm) 1.532 Volume: (cm) 0.306 % Reduction in Area: % Reduction in Volume: Epithelialization: Small (1-33%) Tunneling: Yes Position (o'clock): 2 Maximum Distance: (cm) 0.2 Undermining: No Wound Description Full Thickness Without Exposed Support Classification: Structures Wound Margin: Distinct, outline attached Exudate Large Amount: Exudate Type: Serous Exudate Color: amber Foul Odor After Cleansing: No Slough/Fibrino Yes Wound Bed Granulation Amount: Large (67-100%) Exposed Structure Granulation Quality: Red, Pink Fat Layer (Subcutaneous Tissue) Exposed: Yes Necrotic Amount: Small (1-33%) Periwound Skin Texture Autumn Stephenson, Kynzley ([id number]) Texture Color No Abnormalities Noted: No No Abnormalities Noted: No Callus: No Atrophie Blanche: No Crepitus: No Cyanosis: No Excoriation: No Ecchymosis: No Induration: No Erythema: No Rash: No Hemosiderin Staining: No Scarring:  Yes Mottled: No Pallor: No Moisture Rubor: No No Abnormalities Noted: No Dry / Scaly: No Temperature / Pain Maceration: No Temperature: No Abnormality Tenderness on Palpation: Yes Wound Preparation Ulcer Cleansing: Rinsed/Irrigated with Saline Topical Anesthetic Applied: Other: LIDOCAINE 4%, Treatment Notes Wound #1 (Right, Lateral Upper Leg) 1. Cleansed with: Clean wound with Normal Saline 2. Anesthetic Topical Lidocaine 4% cream to wound bed prior to debridement 4. Dressing Applied: Hydrafera Blue 5. Secondary Dressing Applied [place] Electronic Signature(s) Signed: 01/21/2018 1:45:59 PM By: Renne Crigler Entered By: Renne Crigler on 01/21/2018 10:29:20 Autumn Stephenson ([id number]) -------------------------------------------------------------------------------- Vitals Details Patient Name: Autumn Stephenson Date of Service: 01/21/2018 9:45 AM Medical Record Number: [id number] Patient Account Number: [id number] Date of Birth/Sex: [birth year] (26 y.o. F) Treating RN: Renne Crigler Primary Care Kinslei Labine: SYSTEM, Kasmira Cacioppo Other Clinician: Referring Jamen Loiseau: Elray Buba Treating Ryu Cerreta/Extender: Kathreen Cosier in Treatment: 0 Vital Signs Time Taken: 10:13 Temperature (F): 98.6 Height (in): 63 Pulse (bpm): 95 Source: Stated Respiratory Rate (breaths/min): 18 Weight (lbs): 117 Blood Pressure (mmHg): 114/82 Source: Measured Reference Range: 80 - 120 mg / dl Body Mass Index (BMI): 20.7 Electronic Signature(s) Signed: 01/21/2018 1:45:59 PM By: Renne Crigler Entered By: Renne Crigler on 01/21/2018 10:14:17

## 2018-02-04 ENCOUNTER — Encounter: Payer: BLUE CROSS/BLUE SHIELD | Admitting: Internal Medicine

## 2018-02-04 DIAGNOSIS — L97111 Non-pressure chronic ulcer of right thigh limited to breakdown of skin: Secondary | ICD-10-CM | POA: Diagnosis not present

## 2018-02-11 ENCOUNTER — Ambulatory Visit: Payer: BLUE CROSS/BLUE SHIELD | Admitting: Internal Medicine

## 2018-02-11 NOTE — Progress Notes (Signed)
Autumn Stephenson, [name] ([id number]) Visit Report for 01/28/2018 Chief Complaint Document Details Patient Name: Autumn Stephenson, Autumn Stephenson Date of Service: 01/28/2018 2:45 PM Medical Record Number: [id number] Patient Account Number: [id number] Date of Birth/Sex: [birth year] (27 y.o. F) Treating RN: Huel CoventryWoody, Kim Primary Care Provider: SYSTEM, PROVIDER Other Clinician: Referring Provider: Elray BubaWASHO, MICHAEL Treating Provider/Extender: Kathreen Cosieroulter, Mersedes Alber Weeks in Treatment: 1 Information Obtained from: Patient Chief Complaint right thigh wound Electronic Signature(s) Signed: 01/28/2018 3:09:02 PM By: Bonnell Publicoulter, Karma Hiney Entered By: Bonnell Publicoulter, Koryn Charlot on 01/28/2018 15:09:01 Autumn Stephenson, [name] ([id number]) -------------------------------------------------------------------------------- HPI Details Patient Name: Autumn Stephenson, Autumn Stephenson Date of Service: 01/28/2018 2:45 PM Medical Record Number: [id number] Patient Account Number: [id number] Date of Birth/Sex: [birth year] (27 y.o. F) Treating RN: Huel CoventryWoody, Kim Primary Care Provider: SYSTEM, PROVIDER Other Clinician: Referring Provider: Elray BubaWASHO, MICHAEL Treating Provider/Extender: Kathreen Cosieroulter, Candy Leverett Weeks in Treatment: 1 History of Present Illness HPI Description: 01/21/18-She is here initial evaluation for a right lateral thigh wound. She states she had an abscess, was using warm compress and it spontaneously ruptured 3-4 weeks ago. She has currently been applying mupirocin on daily. She has had 2 separate cycles of seven-day courses of Augmentin and doxycycline. There is no evidence of infection at today's visit. We will initiate hydrofera blue and she will follow up next week. She is voicing no c/o pain 01/28/18-She is here in follow-up evaluation for right lateral thigh wound. She did not tolerate Hydrofera Blue as expected and had some irritation. [facility] and she will follow-up next week for anticipated she will be healed and able to be discharged Electronic Signature(s) Signed: 01/28/2018 3:09:39 PM  By: Bonnell Publicoulter, Garrick Midgley Entered By: Bonnell Publicoulter, Audine Mangione on 01/28/2018 15:09:38 Autumn Stephenson, Oveda ([id number]) -------------------------------------------------------------------------------- Physician Orders Details Patient Name: Autumn Stephenson, Autumn Stephenson Date of Service: 01/28/2018 2:45 PM Medical Record Number: [id number] Patient Account Number: [id number] Date of Birth/Sex: [birth year] (27 y.o. F) Treating RN: Huel CoventryWoody, Kim Primary Care Provider: SYSTEM, PROVIDER Other Clinician: Referring Provider: Elray BubaWASHO, MICHAEL Treating Provider/Extender: Kathreen Cosieroulter, Joh Rao Weeks in Treatment: 1 Verbal / Phone Orders: No Diagnosis Coding Wound Cleansing Wound #1 Right,Lateral Upper Leg o Clean wound with Normal Saline. Anesthetic (add to Medication List) Wound #1 Right,Lateral Upper Leg o Topical Lidocaine 4% cream applied to wound bed prior to debridement (In Clinic Only). Primary Wound Dressing Wound #1 Right,Lateral Upper Leg o Silver Collagen Secondary Dressing Wound #1 Right,Lateral Upper Leg o Boardered Foam Dressing Dressing Change Frequency Wound #1 Right,Lateral Upper Leg o Change Dressing Monday, Wednesday, Friday Follow-up Appointments Wound #1 Right,Lateral Upper Leg o Return Appointment in 1 week. Electronic Signature(s) Signed: 01/28/2018 5:10:54 PM By: Bonnell Publicoulter, Diavion Labrador Signed: 01/28/2018 5:18:50 PM By: Elliot GurneyWoody, BSN, RN, CWS, [name], BSN Entered By: Elliot GurneyWoody, BSN, RN, CWS, Kim on 01/28/2018 15:07:25 Autumn Stephenson, Alden ([id number]) -------------------------------------------------------------------------------- Problem List Details Patient Name: Autumn Stephenson, Autumn Stephenson Date of Service: 01/28/2018 2:45 PM Medical Record Number: [id number] Patient Account Number: [id number] Date of Birth/Sex: [birth year] (27 y.o. 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 01/21/2018 No Yes of skin L02.415 Cutaneous abscess of right lower limb 01/21/2018 No Yes Inactive Problems Resolved Problems Electronic Signature(s) Signed: 01/28/2018 3:08:52 PM By: Bonnell Publicoulter, Nashika Coker Entered By: Bonnell Publicoulter, Chablis Losh on 01/28/2018 15:08:51 Autumn Stephenson, Shina ([id number]) -------------------------------------------------------------------------------- Progress Note Details Patient Name: Autumn Stephenson, Autumn Stephenson Date of Service: 01/28/2018 2:45 PM Medical Record Number: [id number] Patient Account Number: [id number] Date  of Birth/Sex: 1990-10-28 (27 y.o. F) Treating RN: Huel Coventry Primary Care Provider: SYSTEM, PROVIDER Other Clinician: Referring Provider: Elray Buba Treating Provider/Extender: Kathreen Cosier in Treatment: 1 Subjective Chief Complaint Information obtained from Patient right thigh wound History of Present Illness (HPI) 01/21/18-She is here initial evaluation for a right lateral thigh wound. She states she had an abscess, was using warm compress and it spontaneously ruptured 3-4 weeks ago. She has currently been applying mupirocin on daily. She has had 2 separate cycles of seven-day courses of Augmentin and doxycycline. There is no evidence of infection at today's visit. We will initiate hydrofera blue and she will follow up next week. She is voicing no c/o pain 01/28/18-She is here in follow-up evaluation for right lateral thigh wound. She did not tolerate Hydrofera Blue as expected and had some irritation. We will transition to [facility] and she will follow-up next week for anticipated she will be healed and able to be discharged Objective Constitutional Vitals Time Taken: 2:55 PM, Height: 63 in, Weight: 117 lbs, BMI: 20.7, Temperature: 98.2 F, Pulse: 84 bpm, Respiratory Rate: 18 breaths/min, Blood Pressure: 99/73 mmHg. Integumentary (Hair, Skin) Wound #1  status is Open. Original cause of wound was Gradually Appeared. The wound is located on the Right,Lateral Upper Leg. The wound measures 0.8cm length x 0.7cm width x 0.2cm depth; 0.44cm^2 area and 0.088cm^3 volume. There is Fat Layer (Subcutaneous Tissue) Exposed exposed. There is no tunneling or undermining noted. There is a large amount of serosanguineous drainage noted. The wound margin is distinct with the outline attached to the wound base. There is large (67-100%) red, pink granulation within the wound bed. There is no necrotic tissue within the wound bed. The periwound skin appearance exhibited: Scarring. The periwound skin appearance did not exhibit: Callus, Crepitus, Excoriation, Induration, Rash, Dry/Scaly, Maceration, Atrophie Blanche, Cyanosis, Ecchymosis, Hemosiderin Staining, Mottled, Pallor, Rubor, Erythema. Periwound temperature was noted as No Abnormality. The periwound has tenderness on palpation. 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 Anesthetic (add to Medication List): Wound #1 Right,Lateral Upper Leg: Topical Lidocaine 4% cream applied to wound bed prior to debridement (In Clinic Only). Primary Wound Dressing: Wound #1 Right,Lateral Upper Leg: Silver Collagen Secondary Dressing: Wound #1 Right,Lateral Upper Leg: Boardered Foam Dressing Dressing Change Frequency: Wound #1 Right,Lateral Upper Leg: Change Dressing Monday, Wednesday, Friday Follow-up Appointments: Wound #1 Right,Lateral Upper Leg: Return Appointment in 1 week. Electronic Signature(s) Signed: 01/28/2018 3:09:52 PM By: Bonnell Public Entered By: Bonnell Public on 01/28/2018 15:09:52 Autumn Loud  ([id number]) -------------------------------------------------------------------------------- SuperBill Details Patient Name: Autumn Loud Date of Service: 01/28/2018 Medical Record Number: [id number] Patient Account Number: [id number] Date of Birth/Sex: 04-24-1991 (27 y.o. 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 

## 2018-02-12 ENCOUNTER — Encounter: Payer: BLUE CROSS/BLUE SHIELD | Admitting: Physician Assistant

## 2018-02-12 DIAGNOSIS — L97111 Non-pressure chronic ulcer of right thigh limited to breakdown of skin: Secondary | ICD-10-CM | POA: Diagnosis not present

## 2018-02-12 NOTE — Progress Notes (Signed)
Autumn LoudMASON, Jewelz ([id number]) Visit Report for 01/28/2018 Arrival Information Details Patient Name: Autumn LoudMASON, Autumn Stephenson Date of Service: 01/28/2018 2:45 PM Medical Record Number: [id number] Patient Account Number: [id number] Date of Birth/Sex: [birth year] (26 y.o. F) Treating RN: Phillis HaggisPinkerton, Debi Primary Care Garry Nicolini: SYSTEM, [place] Sigmund Other Clinician: Referring Mairead Schwarzkopf: Elray BubaWASHO, MICHAEL Treating Janasha Barkalow/Extender: Kathreen Cosieroulter, Leah Weeks in Treatment: 1 Visit Information History Since Last Visit All ordered tests and consults were completed: No Patient Arrived: Ambulatory Added or deleted any medications: No Arrival Time: 14:53 Any new allergies or adverse reactions: No Accompanied By: self Had a fall or experienced change in No Transfer Assistance: None activities of daily living that may affect Patient Identification Verified: Yes risk of falls: Secondary Verification Process Completed: Yes Signs or symptoms of abuse/neglect since last visito No Patient Requires Transmission-Based No Hospitalized since last visit: No Precautions: Implantable device outside of the clinic excluding No Patient Has Alerts: No cellular tissue based products placed in the center since last visit: Has Dressing in Place as Prescribed: Yes Pain Present Now: No Electronic Signature(s) Signed: 02/02/2018 5:34:19 PM By: Alejandro MullingPinkerton, Debra Entered By: Alejandro MullingPinkerton, Debra on 01/28/2018 14:54:32 Autumn LoudMASON, Autumn Stephenson ([id number]) -------------------------------------------------------------------------------- Clinic Level of Care Assessment Details Patient Name: Autumn LoudMASON, Autumn Stephenson Date of Service: 01/28/2018 2:45 PM Medical Record Number: [id number] Patient Account Number: [id number] Date of Birth/Sex: [birth year] (26 y.o. F) Treating RN: Huel CoventryWoody, Kim Primary Care Neasia Fleeman: SYSTEM, Minoru Chap Other Clinician: Referring Inell Mimbs: Elray BubaWASHO, MICHAEL Treating Lanessa Shill/Extender: Kathreen Cosieroulter, Leah Weeks in Treatment: 1 Clinic Level of Care Assessment Items TOOL  4 Quantity Score []  - Use when only an EandM is performed on FOLLOW-UP visit 0 ASSESSMENTS - Nursing Assessment / Reassessment []  - Reassessment of Co-morbidities (includes updates in patient status) 0 X- 1 5 Reassessment of Adherence to Treatment Plan ASSESSMENTS - Wound and Skin Assessment / Reassessment X - Simple Wound Assessment / Reassessment - one wound 1 5 []  - 0 Complex Wound Assessment / Reassessment - multiple wounds []  - 0 Dermatologic / Skin Assessment (not related to wound area) ASSESSMENTS - Focused Assessment []  - Circumferential Edema Measurements - multi extremities 0 []  - 0 Nutritional Assessment / Counseling / Intervention []  - 0 Lower Extremity Assessment (monofilament, tuning fork, pulses) []  - 0 Peripheral Arterial Disease Assessment (using hand held doppler) ASSESSMENTS - Ostomy and/or Continence Assessment and Care []  - Incontinence Assessment and Management 0 []  - 0 Ostomy Care Assessment and Management (repouching, etc.) PROCESS - Coordination of Care X - Simple Patient / Family Education for ongoing care 1 15 []  - 0 Complex (extensive) Patient / Family Education for ongoing care []  - 0 Staff obtains ChiropractorConsents, Records, Test Results / Process Orders []  - 0 Staff telephones HHA, Nursing Homes / Clarify orders / etc []  - 0 Routine Transfer to another Facility (non-emergent condition) []  - 0 Routine Hospital Admission (non-emergent condition) []  - 0 New Admissions / Manufacturing engineernsurance Authorizations / Ordering NPWT, Apligraf, etc. []  - 0 Emergency Hospital Admission (emergent condition) X- 1 10 Simple Discharge Coordination Autumn LoudMASON, Autumn Stephenson ([id number]) []  - 0 Complex (extensive) Discharge Coordination PROCESS - Special Needs []  - Pediatric / Minor Patient Management 0 []  - 0 Isolation Patient Management []  - 0 Hearing / Language / Visual special needs []  - 0 Assessment of Community assistance (transportation, D/C planning, etc.) []  - 0 Additional  assistance / Altered mentation []  - 0 Support Surface(s) Assessment (bed, cushion, seat, etc.) INTERVENTIONS - Wound Cleansing / Measurement X - Simple Wound Cleansing - one wound 1 5 []  - 0 Complex Wound  Cleansing - multiple wounds X- 1 5 Wound Imaging (photographs - any number of wounds) []  - 0 Wound Tracing (instead of photographs) X- 1 5 Simple Wound Measurement - one wound []  - 0 Complex Wound Measurement - multiple wounds INTERVENTIONS - Wound Dressings X - Small Wound Dressing one or multiple wounds 1 10 []  - 0 Medium Wound Dressing one or multiple wounds []  - 0 Large Wound Dressing one or multiple wounds []  - 0 Application of Medications - topical []  - 0 Application of Medications - injection INTERVENTIONS - Miscellaneous []  - External ear exam 0 []  - 0 Specimen Collection (cultures, biopsies, blood, body fluids, etc.) []  - 0 Specimen(s) / Culture(s) sent or taken to Lab for analysis []  - 0 Patient Transfer (multiple staff / Nurse, adult / Similar devices) []  - 0 Simple Staple / Suture removal (25 or less) []  - 0 Complex Staple / Suture removal (26 or more) []  - 0 Hypo / Hyperglycemic Management (close monitor of Blood Glucose) []  - 0 Ankle / Brachial Index (ABI) - do not check if billed separately X- 1 5 Vital Signs MASON, Autumn Stephenson ([id number]) Has the patient been seen at the hospital within the last three years: Yes Total Score: 65 Level Of Care: New/Established - Level 2 Electronic Signature(s) Signed: 01/28/2018 5:18:50 PM By: Elliot Gurney, BSN, RN, CWS, [name], BSN Entered By: Elliot Gurney, BSN, RN, CWS, Kim on 01/28/2018 15:07:46 Autumn Stephenson ([id number]) -------------------------------------------------------------------------------- Encounter Discharge Information Details Patient Name: Autumn Stephenson Date of Service: 01/28/2018 2:45 PM Medical Record Number: [id number] Patient Account Number: [id number] Date of Birth/Sex: 03-13-1991 (26 y.o. F) Treating RN: Huel Coventry Primary Care Denette Hass: SYSTEM, Henchy Mccauley Other Clinician: Referring Momoko Slezak: Elray Buba Treating Marlaina Coburn/Extender: Kathreen Cosier in Treatment: 1 Encounter Discharge Information Items Discharge Condition: Stable Ambulatory Status: Ambulatory Discharge Destination: Home Transportation: Private Auto Accompanied By: self Schedule Follow-up Appointment: Yes Clinical Summary of Care: Electronic Signature(s) Signed: 01/28/2018 5:18:50 PM By: Elliot Gurney, BSN, RN, CWS, [name], BSN Entered By: Elliot Gurney, BSN, RN, CWS, Kim on 01/28/2018 15:08:28 Autumn Stephenson ([id number]) -------------------------------------------------------------------------------- Lower Extremity Assessment Details Patient Name: Autumn Stephenson Date of Service: 01/28/2018 2:45 PM Medical Record Number: [id number] Patient Account Number: [id number] Date of Birth/Sex: 1991/04/08 (26 y.o. F) Treating RN: Phillis Haggis Primary Care Kysen Wetherington: SYSTEM, Kameria Canizares Other Clinician: Referring Merit Gadsby: Elray Buba Treating Dwyane Dupree/Extender: Kathreen Cosier in Treatment: 1 Electronic Signature(s) Signed: 02/02/2018 5:34:19 PM By: Alejandro Mulling Entered By: Alejandro Mulling on 01/28/2018 14:59:56 Autumn Stephenson ([id number]) -------------------------------------------------------------------------------- Multi Wound Chart Details Patient Name: Autumn Stephenson Date of Service: 01/28/2018 2:45 PM Medical Record Number: [id number] Patient Account Number: [id number] Date of Birth/Sex: 1990-11-15 (26 y.o. F) Treating RN: Huel Coventry Primary Care Davelyn Gwinn: SYSTEM, Tanner Yeley Other Clinician: Referring Laneisha Mino: Elray Buba Treating Naziyah Tieszen/Extender: Kathreen Cosier in Treatment: 1 Vital Signs Height(in): 63 Pulse(bpm): 84 Weight(lbs): 117 Blood Pressure(mmHg): 99/73 Body Mass Index(BMI): 21 Temperature(F): 98.2 Respiratory Rate 18 (breaths/min): Photos: [1:No Photos] [N/A:N/A] Wound Location: [1:Right Upper Leg -  Lateral] [N/A:N/A] Wounding Event: [1:Gradually Appeared] [N/A:N/A] Primary Etiology: [1:To be determined] [N/A:N/A] Comorbid History: [1:Asthma] [N/A:N/A] Date Acquired: [1:12/15/2017] [N/A:N/A] Weeks of Treatment: [1:1] [N/A:N/A] Wound Status: [1:Open] [N/A:N/A] Measurements L x W x D [1:0.8x0.7x0.2] [N/A:N/A] (cm) Area (cm) : [1:0.44] [N/A:N/A] Volume (cm) : [1:0.088] [N/A:N/A] % Reduction in Area: [1:71.30%] [N/A:N/A] % Reduction in Volume: [1:71.20%] [N/A:N/A] Classification: [1:Full Thickness Without Exposed Support Structures] [N/A:N/A] Exudate Amount: [1:Large] [N/A:N/A] Exudate Type: [1:Serosanguineous] [N/A:N/A] Exudate Color: [1:red, brown] [N/A:N/A] Wound Margin: [1:Distinct, outline attached] [N/A:N/A] Granulation  Amount: [1:Large (67-100%)] [N/A:N/A] Granulation Quality: [1:Red, Pink] [N/A:N/A] Necrotic Amount: [1:None Present (0%)] [N/A:N/A] Exposed Structures: [1:Fat Layer (Subcutaneous Tissue) Exposed: Yes] [N/A:N/A] Epithelialization: [1:Small (1-33%)] [N/A:N/A] Periwound Skin Texture: [1:Scarring: Yes Excoriation: No Induration: No Callus: No Crepitus: No Rash: No] [N/A:N/A] Periwound Skin Moisture: [1:Maceration: No Dry/Scaly: No] [N/A:N/A] Periwound Skin Color: [1:Atrophie Blanche: No Cyanosis: No Ecchymosis: No] [N/A:N/A] Erythema: No Hemosiderin Staining: No Mottled: No Pallor: No Rubor: No Temperature: No Abnormality N/A N/A Tenderness on Palpation: Yes N/A N/A Wound Preparation: Ulcer Cleansing: N/A N/A Rinsed/Irrigated with Saline Topical Anesthetic Applied: Other: LIDOCAINE 4% Treatment Notes Electronic Signature(s) Signed: 01/28/2018 5:18:50 PM By: Elliot Gurney, BSN, RN, CWS, [name], BSN Entered By: Elliot Gurney, BSN, RN, CWS, Kim on 01/28/2018 15:06:33 Autumn Stephenson ([id number]) -------------------------------------------------------------------------------- Multi-Disciplinary Care Plan Details Patient Name: Autumn Stephenson Date of Service: 01/28/2018 2:45  PM Medical Record Number: [id number] Patient Account Number: [id number] Date of Birth/Sex: May 16, 1991 (26 y.o. F) Treating RN: Huel Coventry Primary Care Jasmeet Manton: SYSTEM, Desirae Mancusi Other Clinician: Referring Karess Harner: Elray Buba Treating Kenyette Gundy/Extender: Kathreen Cosier in Treatment: 1 Active Inactive ` Orientation to the Wound Care Program Nursing Diagnoses: Knowledge deficit related to the wound healing center program Goals: Patient/caregiver will verbalize understanding of the Wound Healing Center Program Date Initiated: 01/21/2018 Target Resolution Date: 02/13/2018 Goal Status: Active Interventions: Provide education on orientation to the wound center Notes: ` Soft Tissue Infection Nursing Diagnoses: Impaired tissue integrity Goals: Patient will remain free of wound infection Date Initiated: 01/21/2018 Target Resolution Date: 02/13/2018 Goal Status: Active Interventions: Provide education on infection Treatment Activities: Education provided on Infection : 01/21/2018 Systemic antibiotics : 01/21/2018 Notes: ` Wound/Skin Impairment Nursing Diagnoses: Impaired tissue integrity Goals: Ulcer/skin breakdown will have a volume reduction of 30% by week 4 Autumn Stephenson, DIMARIO ([id number]) Date Initiated: 01/21/2018 Target Resolution Date: 02/21/2018 Goal Status: Active Interventions: Assess ulceration(s) every visit Treatment Activities: Skin care regimen initiated : 01/21/2018 Notes: Electronic Signature(s) Signed: 01/28/2018 5:18:50 PM By: Elliot Gurney, BSN, RN, CWS, [name], BSN Entered By: Elliot Gurney, BSN, RN, CWS, Kim on 01/28/2018 15:06:26 Autumn Stephenson ([id number]) -------------------------------------------------------------------------------- Pain Assessment Details Patient Name: Autumn Stephenson Date of Service: 01/28/2018 2:45 PM Medical Record Number: [id number] Patient Account Number: [id number] Date of Birth/Sex: 1990-08-29 (26 y.o. F) Treating RN: Phillis Haggis Primary Care Ainara Eldridge:  SYSTEM, Cinzia Devos Other Clinician: Referring Nysha Koplin: Elray Buba Treating Yoshiharu Brassell/Extender: Kathreen Cosier in Treatment: 1 Active Problems Location of Pain Severity and Description of Pain Patient Has Paino No Site Locations Pain Management and Medication Current Pain Management: Electronic Signature(s) Signed: 02/02/2018 5:34:19 PM By: Alejandro Mulling Entered By: Alejandro Mulling on 01/28/2018 14:55:25 Autumn Stephenson ([id number]) -------------------------------------------------------------------------------- Patient/Caregiver Education Details Patient Name: Autumn Stephenson Date of Service: 01/28/2018 2:45 PM Medical Record Number: [id number] Patient Account Number: [id number] Date of Birth/Gender: 1990-11-26 (26 y.o. F) Treating RN: Huel Coventry Primary Care Physician: SYSTEM, Bettie Swavely Other Clinician: Referring Physician: Elray Buba Treating Physician/Extender: Kathreen Cosier in Treatment: 1 Education Assessment Education Provided To: Patient Education Topics Provided Wound/Skin Impairment: Handouts: Caring for Your Ulcer Methods: Demonstration, Explain/Verbal Responses: State content correctly Electronic Signature(s) Signed: 01/28/2018 5:18:50 PM By: Elliot Gurney, BSN, RN, CWS, [name], BSN Entered By: Elliot Gurney, BSN, RN, CWS, Kim on 01/28/2018 15:08:41 Autumn Stephenson ([id number]) -------------------------------------------------------------------------------- Wound Assessment Details Patient Name: Autumn Stephenson Date of Service: 01/28/2018 2:45 PM Medical Record Number: [id number] Patient Account Number: [id number] Date of Birth/Sex: April 27, 1991 (26 y.o. F) Treating RN: Phillis Haggis Primary Care Huntley Knoop: SYSTEM, Ianna Salmela Other Clinician: Referring Yudit Modesitt: Elray Buba Treating Sharon Rubis/Extender: Kathreen Cosier  in Treatment: 1 Wound Status Wound Number: 1 Primary Etiology: To be determined Wound Location: Right Upper Leg - Lateral Wound Status: Open Wounding  Event: Gradually Appeared Comorbid History: Asthma Date Acquired: 12/15/2017 Weeks Of Treatment: 1 Clustered Wound: No Photos Photo Uploaded By: Alejandro Mulling on 01/28/2018 16:17:42 Wound Measurements Length: (cm) 0.8 Width: (cm) 0.7 Depth: (cm) 0.2 Area: (cm) 0.44 Volume: (cm) 0.088 % Reduction in Area: 71.3% % Reduction in Volume: 71.2% Epithelialization: Small (1-33%) Tunneling: No Undermining: No Wound Description Full Thickness Without Exposed Support Classification: Structures Wound Margin: Distinct, outline attached Exudate Large Amount: Exudate Type: Serosanguineous Exudate Color: red, brown Foul Odor After Cleansing: No Slough/Fibrino Yes Wound Bed Granulation Amount: Large (67-100%) Exposed Structure Granulation Quality: Red, Pink Fat Layer (Subcutaneous Tissue) Exposed: Yes Necrotic Amount: None Present (0%) Periwound Skin Texture Texture Color No Abnormalities Noted: No No Abnormalities Noted: No Callus: No Atrophie Blanche: No TAMORAH, Autumn Stephenson ([id number]) Crepitus: No Cyanosis: No Excoriation: No Ecchymosis: No Induration: No Erythema: No Rash: No Hemosiderin Staining: No Scarring: Yes Mottled: No Pallor: No Moisture Rubor: No No Abnormalities Noted: No Dry / Scaly: No Temperature / Pain Maceration: No Temperature: No Abnormality Tenderness on Palpation: Yes Wound Preparation Ulcer Cleansing: Rinsed/Irrigated with Saline Topical Anesthetic Applied: Other: LIDOCAINE 4%, Electronic Signature(s) Signed: 02/02/2018 5:34:19 PM By: Alejandro Mulling Entered By: Alejandro Mulling on 01/28/2018 14:59:13 Autumn Stephenson ([id number]) -------------------------------------------------------------------------------- Vitals Details Patient Name: Autumn Stephenson Date of Service: 01/28/2018 2:45 PM Medical Record Number: [id number] Patient Account Number: [id number] Date of Birth/Sex: Jul 31, 1990 (26 y.o. F) Treating RN: Phillis Haggis Primary Care Ulmer Degen:  SYSTEM, Klinton Candelas Other Clinician: Referring Jagger Demonte: Elray Buba Treating Shonika Kolasinski/Extender: Kathreen Cosier in Treatment: 1 Vital Signs Time Taken: 14:55 Temperature (F): 98.2 Height (in): 63 Pulse (bpm): 84 Weight (lbs): 117 Respiratory Rate (breaths/min): 18 Body Mass Index (BMI): 20.7 Blood Pressure (mmHg): 99/73 Reference Range: 80 - 120 mg / dl Electronic Signature(s) Signed: 02/02/2018 5:34:19 PM By: Alejandro Mulling Entered By: Alejandro Mulling on 01/28/2018 14:55:45

## 2018-02-13 NOTE — Progress Notes (Signed)
Autumn Stephenson ([id number]) Visit Report for 02/04/2018 Arrival Information Details Patient Name: Autumn Stephenson Date of Service: 02/04/2018 2:30 PM Medical Record Number: [id number] Patient Account Number: [id number] Date of Birth/Sex: 11-13-1990 (26 y.o. F) Treating RN: Rema Jasmine Primary Care Davidjames Blansett: SYSTEM, Chriss Redel Other Clinician: Referring Aldonia Keeven: Elray Buba Treating Ade Stmarie/Extender: Altamese [place] in Treatment: 2 Visit Information History Since Last Visit Added or deleted any medications: No Patient Arrived: Ambulatory Any new allergies or adverse reactions: No Arrival Time: 14:55 Had a fall or experienced change in No Accompanied By: self activities of daily living that may affect Transfer Assistance: None risk of falls: Patient Identification Verified: Yes Signs or symptoms of abuse/neglect since last visito No Secondary Verification Process Completed: Yes Hospitalized since last visit: No Patient Requires Transmission-Based No Implantable device outside of the clinic excluding No Precautions: cellular tissue based products placed in the center Patient Has Alerts: No since last visit: Has Dressing in Place as Prescribed: Yes Pain Present Now: No Electronic Signature(s) Signed: 02/04/2018 3:41:35 PM By: Rema Jasmine Entered By: Rema Jasmine on 02/04/2018 14:58:09 Autumn Stephenson ([id number]) -------------------------------------------------------------------------------- Clinic Level of Care Assessment Details Patient Name: Autumn Stephenson Date of Service: 02/04/2018 2:30 PM Medical Record Number: [id number] Patient Account Number: [id number] Date of Birth/Sex: 1990/10/08 (26 y.o. F) Treating RN: Curtis Sites Primary Care Lexine Jaspers: SYSTEM, Starlee Corralejo Other Clinician: Referring Karey Stucki: Elray Buba Treating Henretter Piekarski/Extender: Altamese [place] in Treatment: 2 Clinic Level of Care Assessment Items TOOL 4 Quantity Score []  - Use when only an EandM is performed  on FOLLOW-UP visit 0 ASSESSMENTS - Nursing Assessment / Reassessment X - Reassessment of Co-morbidities (includes updates in patient status) 1 10 X- 1 5 Reassessment of Adherence to Treatment Plan ASSESSMENTS - Wound and Skin Assessment / Reassessment X - Simple Wound Assessment / Reassessment - one wound 1 5 []  - 0 Complex Wound Assessment / Reassessment - multiple wounds []  - 0 Dermatologic / Skin Assessment (not related to wound area) ASSESSMENTS - Focused Assessment []  - Circumferential Edema Measurements - multi extremities 0 []  - 0 Nutritional Assessment / Counseling / Intervention X- 1 5 Lower Extremity Assessment (monofilament, tuning fork, pulses) []  - 0 Peripheral Arterial Disease Assessment (using hand held doppler) ASSESSMENTS - Ostomy and/or Continence Assessment and Care []  - Incontinence Assessment and Management 0 []  - 0 Ostomy Care Assessment and Management (repouching, etc.) PROCESS - Coordination of Care X - Simple Patient / Family Education for ongoing care 1 15 []  - 0 Complex (extensive) Patient / Family Education for ongoing care []  - 0 Staff obtains Chiropractor, Records, Test Results / Process Orders []  - 0 Staff telephones HHA, Nursing Homes / Clarify orders / etc []  - 0 Routine Transfer to another Facility (non-emergent condition) []  - 0 Routine Hospital Admission (non-emergent condition) []  - 0 New Admissions / Manufacturing engineer / Ordering NPWT, Apligraf, etc. []  - 0 Emergency Hospital Admission (emergent condition) X- 1 10 Simple Discharge Coordination Autumn Stephenson ([id number]) []  - 0 Complex (extensive) Discharge Coordination PROCESS - Special Needs []  - Pediatric / Minor Patient Management 0 []  - 0 Isolation Patient Management []  - 0 Hearing / Language / Visual special needs []  - 0 Assessment of Community assistance (transportation, D/C planning, etc.) []  - 0 Additional assistance / Altered mentation []  - 0 Support Surface(s)  Assessment (bed, cushion, seat, etc.) INTERVENTIONS - Wound Cleansing / Measurement X - Simple Wound Cleansing - one wound 1 5 []  - 0 Complex Wound Cleansing - multiple wounds X-  1 5 Wound Imaging (photographs - any number of wounds) []  - 0 Wound Tracing (instead of photographs) X- 1 5 Simple Wound Measurement - one wound []  - 0 Complex Wound Measurement - multiple wounds INTERVENTIONS - Wound Dressings X - Small Wound Dressing one or multiple wounds 1 10 []  - 0 Medium Wound Dressing one or multiple wounds []  - 0 Large Wound Dressing one or multiple wounds []  - 0 Application of Medications - topical []  - 0 Application of Medications - injection INTERVENTIONS - Miscellaneous []  - External ear exam 0 []  - 0 Specimen Collection (cultures, biopsies, blood, body fluids, etc.) []  - 0 Specimen(s) / Culture(s) sent or taken to Lab for analysis []  - 0 Patient Transfer (multiple staff / Nurse, adultHoyer Lift / Similar devices) []  - 0 Simple Staple / Suture removal (25 or less) []  - 0 Complex Staple / Suture removal (26 or more) []  - 0 Hypo / Hyperglycemic Management (close monitor of Blood Glucose) []  - 0 Ankle / Brachial Index (ABI) - do not check if billed separately X- 1 5 Vital Signs Autumn Stephenson ([id number]) Has the patient been seen at the hospital within the last three years: Yes Total Score: 80 Level Of Care: New/Established - Level 3 Electronic Signature(s) Signed: 02/04/2018 5:18:29 PM By: Curtis Sitesorthy, Joanna Entered By: Curtis Sitesorthy, Joanna on 02/04/2018 15:33:37 Autumn Stephenson ([id number]) -------------------------------------------------------------------------------- Encounter Discharge Information Details Patient Name: Autumn Stephenson Date of Service: 02/04/2018 2:30 PM Medical Record Number: [id number] Patient Account Number: [id number] Date of Birth/Sex: [birth year] (26 y.o. F) Treating RN: Curtis Sitesorthy, Joanna Primary Care Kallista Pae: SYSTEM, Tecora Eustache Other Clinician: Referring Sante Biedermann: Elray BubaWASHO,  Stephenson Treating Sheritha Louis/Extender: Altamese CarolinaOBSON, Stephenson G Weeks in Treatment: 2 Encounter Discharge Information Items Discharge Condition: Stable Ambulatory Status: Ambulatory Discharge Destination: Home Transportation: Private Auto Accompanied By: self Schedule Follow-up Appointment: Yes Clinical Summary of Care: Electronic Signature(s) Signed: 02/04/2018 3:34:13 PM By: Curtis Sitesorthy, Joanna Entered By: Curtis Sitesorthy, Joanna on 02/04/2018 15:34:13 Autumn Stephenson ([id number]) -------------------------------------------------------------------------------- Lower Extremity Assessment Details Patient Name: Autumn Stephenson Stephenson Date of Service: 02/04/2018 2:30 PM Medical Record Number: [id number] Patient Account Number: [id number] Date of Birth/Sex: [birth year] (26 y.o. F) Treating RN: Rema JasmineNg, Wendi Primary Care Nakeda Lebron: SYSTEM, Rilley Stash Other Clinician: Referring Dewie Ahart: Elray BubaWASHO, Stephenson Treating Sahiti Joswick/Extender: Maxwell CaulOBSON, Stephenson G Weeks in Treatment: 2 Electronic Signature(s) Signed: 02/04/2018 3:41:35 PM By: Rema JasmineNg, Wendi Entered By: Rema JasmineNg, Wendi on 02/04/2018 15:07:04 Autumn Stephenson ([id number]) -------------------------------------------------------------------------------- Multi Wound Chart Details Patient Name: Autumn Stephenson Stephenson Date of Service: 02/04/2018 2:30 PM Medical Record Number: [id number] Patient Account Number: [id number] Date of Birth/Sex: [birth year] (26 y.o. F) Treating RN: Curtis Sitesorthy, Joanna Primary Care Jarid Sasso: SYSTEM, Gerard Cantara Other Clinician: Referring Bienvenido Proehl: Elray BubaWASHO, Stephenson Treating Nautica Hotz/Extender: Altamese CarolinaOBSON, Stephenson G Weeks in Treatment: 2 Vital Signs Height(in): 63 Pulse(bpm): 75 Weight(lbs): 117 Blood Pressure(mmHg): 96/62 Body Mass Index(BMI): 21 Temperature(F): 98.5 Respiratory Rate 16 (breaths/min): Photos: [N/A:N/A] Wound Location: Right Upper Leg - Lateral N/A N/A Wounding Event: Gradually Appeared N/A N/A Primary Etiology: Abscess N/A N/A Comorbid History: Asthma N/A N/A Date Acquired:  12/15/2017 N/A N/A Weeks of Treatment: 2 N/A N/A Wound Status: Open N/A N/A Clustered Wound: Yes N/A N/A Measurements L x W x D 1.7x1.6x0.2 N/A N/A (cm) Area (cm) : 2.136 N/A N/A Volume (cm) : 0.427 N/A N/A % Reduction in Area: -39.40% N/A N/A % Reduction in Volume: -39.50% N/A N/A Classification: Full Thickness Without N/A N/A Exposed Support Structures Exudate Amount: Medium N/A N/A Exudate Type: Serosanguineous N/A N/A Exudate Color: red, brown N/A N/A Wound Margin: Distinct, outline attached N/A N/A Granulation  Amount: Medium (34-66%) N/A N/A Granulation Quality: Red, Pink N/A N/A Necrotic Amount: None Present (0%) N/A N/A Exposed Structures: Fat Layer (Subcutaneous N/A N/A Tissue) Exposed: Yes Epithelialization: Small (1-33%) N/A N/A Periwound Skin Texture: Scarring: Yes N/A N/A Excoriation: No Induration: No Autumn Stephenson ([id number]) Callus: No Crepitus: No Rash: No Periwound Skin Moisture: Maceration: No N/A N/A Dry/Scaly: No Periwound Skin Color: Erythema: Yes N/A N/A Atrophie Blanche: No Cyanosis: No Ecchymosis: No Hemosiderin Staining: No Mottled: No Pallor: No Rubor: No Erythema Location: Circumferential N/A N/A Temperature: No Abnormality N/A N/A Tenderness on Palpation: Yes N/A N/A Wound Preparation: Ulcer Cleansing: N/A N/A Rinsed/Irrigated with Saline Topical Anesthetic Applied: Other: LIDOCAINE 4% Treatment Notes Wound #1 (Right, Lateral Upper Leg) 1. Cleansed with: Clean wound with Normal Saline 2. Anesthetic Topical Lidocaine 4% cream to wound bed prior to debridement 4. Dressing Applied: Calcium Alginate with Silver 5. Secondary Dressing Applied Bordered Foam Dressing Electronic Signature(s) Signed: 02/04/2018 5:35:06 PM By: Baltazar Najjar MD Entered By: Baltazar Najjar on 02/04/2018 16:03:43 Autumn Stephenson ([id number]) -------------------------------------------------------------------------------- Multi-Disciplinary Care Plan  Details Patient Name: Autumn Stephenson Date of Service: 02/04/2018 2:30 PM Medical Record Number: [id number] Patient Account Number: [id number] Date of Birth/Sex: 01-Jul-1990 (26 y.o. F) Treating RN: Curtis Sites Primary Care Edia Pursifull: SYSTEM, Airanna Partin Other Clinician: Referring Rasheedah Reis: Elray Buba Treating Harryette Shuart/Extender: Altamese [place] in Treatment: 2 Active Inactive ` Orientation to the Wound Care Program Nursing Diagnoses: Knowledge deficit related to the wound healing center program Goals: Patient/caregiver will verbalize understanding of the Wound Healing Center Program Date Initiated: 01/21/2018 Target Resolution Date: 02/13/2018 Goal Status: Active Interventions: Provide education on orientation to the wound center Notes: ` Soft Tissue Infection Nursing Diagnoses: Impaired tissue integrity Goals: Patient will remain free of wound infection Date Initiated: 01/21/2018 Target Resolution Date: 02/13/2018 Goal Status: Active Interventions: Provide education on infection Treatment Activities: Education provided on Infection : 01/21/2018 Systemic antibiotics : 01/21/2018 Notes: ` Wound/Skin Impairment Nursing Diagnoses: Impaired tissue integrity Goals: Ulcer/skin breakdown will have a volume reduction of 30% by week 4 Autumn, Stephenson ([id number]) Date Initiated: 01/21/2018 Target Resolution Date: 02/21/2018 Goal Status: Active Interventions: Assess ulceration(s) every visit Treatment Activities: Skin care regimen initiated : 01/21/2018 Notes: Electronic Signature(s) Signed: 02/04/2018 5:18:29 PM By: Curtis Sites Entered By: Curtis Sites on 02/04/2018 15:26:58 Autumn Stephenson ([id number]) -------------------------------------------------------------------------------- Pain Assessment Details Patient Name: Autumn Stephenson Date of Service: 02/04/2018 2:30 PM Medical Record Number: [id number] Patient Account Number: [id number] Date of Birth/Sex: December 09, 1990 (26 y.o.  F) Treating RN: Rema Jasmine Primary Care Nathon Stefanski: SYSTEM, Catrina Fellenz Other Clinician: Referring Wonda Goodgame: Elray Buba Treating Alik Mawson/Extender: Altamese [place] in Treatment: 2 Active Problems Location of Pain Severity and Description of Pain Patient Has Paino No Site Locations Pain Management and Medication Current Pain Management: Goals for Pain Management pt denies any pain at this time. Electronic Signature(s) Signed: 02/04/2018 3:41:35 PM By: Rema Jasmine Entered By: Rema Jasmine on 02/04/2018 14:58:32 Autumn Stephenson ([id number]) -------------------------------------------------------------------------------- Patient/Caregiver Education Details Patient Name: Autumn Stephenson Date of Service: 02/04/2018 2:30 PM Medical Record Number: [id number] Patient Account Number: [id number] Date of Birth/Gender: 1990-09-04 (26 y.o. F) Treating RN: Curtis Sites Primary Care Physician: SYSTEM, Christne Platts Other Clinician: Referring Physician: Elray Buba Treating Physician/Extender: Altamese [place] in Treatment: 2 Education Assessment Education Provided To: Patient Education Topics Provided Wound/Skin Impairment: Handouts: Other: wound care s ordered Methods: Demonstration, Explain/Verbal Responses: State content correctly Electronic Signature(s) Signed: 02/04/2018 5:18:29 PM By: Curtis Sites Entered By: Curtis Sites on 02/04/2018 15:34:32 Autumn Stephenson March ([id number]) --------------------------------------------------------------------------------  Wound Assessment Details Patient Name: Autumn, Stephenson Date of Service: 02/04/2018 2:30 PM Medical Record Number: [id number] Patient Account Number: [id number] Date of Birth/Sex: Feb 24, 1991 (26 y.o. F) Treating RN: Rema Jasmine Primary Care Ariannah Arenson: SYSTEM, Quintana Canelo Other Clinician: Referring Antanasia Kaczynski: Elray Buba Treating Deshana Rominger/Extender: Maxwell Caul Weeks in Treatment: 2 Wound Status Wound Number: 1 Primary Etiology:  Abscess Wound Location: Right Upper Leg - Lateral Wound Status: Open Wounding Event: Gradually Appeared Comorbid History: Asthma Date Acquired: 12/15/2017 Weeks Of Treatment: 2 Clustered Wound: Yes Photos Photo Uploaded By: Rema Jasmine on 02/04/2018 15:39:49 Wound Measurements Length: (cm) 1.7 Width: (cm) 1.6 Depth: (cm) 0.2 Area: (cm) 2.136 Volume: (cm) 0.427 % Reduction in Area: -39.4% % Reduction in Volume: -39.5% Epithelialization: Small (1-33%) Tunneling: No Undermining: No Wound Description Full Thickness Without Exposed Support Classification: Structures Wound Margin: Distinct, outline attached Exudate Medium Amount: Exudate Type: Serosanguineous Exudate Color: red, brown Foul Odor After Cleansing: No Slough/Fibrino Yes Wound Bed Granulation Amount: Medium (34-66%) Exposed Structure Granulation Quality: Red, Pink Fat Layer (Subcutaneous Tissue) Exposed: Yes Necrotic Amount: None Present (0%) Periwound Skin Texture Texture Color No Abnormalities Noted: No No Abnormalities Noted: No Callus: No Atrophie Blanche: No TABBITHA, JANVRIN ([id number]) Crepitus: No Cyanosis: No Excoriation: No Ecchymosis: No Induration: No Erythema: Yes Rash: No Erythema Location: Circumferential Scarring: Yes Hemosiderin Staining: No Mottled: No Moisture Pallor: No No Abnormalities Noted: No Rubor: No Dry / Scaly: No Maceration: No Temperature / Pain Temperature: No Abnormality Tenderness on Palpation: Yes Wound Preparation Ulcer Cleansing: Rinsed/Irrigated with Saline Topical Anesthetic Applied: Other: LIDOCAINE 4%, Treatment Notes Wound #1 (Right, Lateral Upper Leg) 1. Cleansed with: Clean wound with Normal Saline 2. Anesthetic Topical Lidocaine 4% cream to wound bed prior to debridement 4. Dressing Applied: Calcium Alginate with Silver 5. Secondary Dressing Applied Bordered Foam Dressing Electronic Signature(s) Signed: 02/04/2018 3:41:35 PM By: Rema Jasmine Entered By: Rema Jasmine on 02/04/2018 15:06:54 Autumn Stephenson ([id number]) -------------------------------------------------------------------------------- Vitals Details Patient Name: Autumn Stephenson Date of Service: 02/04/2018 2:30 PM Medical Record Number: [id number] Patient Account Number: [id number] Date of Birth/Sex: May 07, 1991 (26 y.o. F) Treating RN: Rema Jasmine Primary Care Rieley Khalsa: SYSTEM, Ryle Buscemi Other Clinician: Referring Dung Prien: Elray Buba Treating Yasmina [place]/Extender: Altamese [place] in Treatment: 2 Vital Signs Time Taken: 14:50 Temperature (F): 98.5 Height (in): 63 Pulse (bpm): 75 Weight (lbs): 117 Respiratory Rate (breaths/min): 16 Body Mass Index (BMI): 20.7 Blood Pressure (mmHg): 96/62 Reference Range: 80 - 120 mg / dl Electronic Signature(s) Signed: 02/04/2018 3:41:35 PM By: Rema Jasmine Entered ByRema Jasmine on 02/04/2018 14:59:12

## 2018-02-13 NOTE — Progress Notes (Signed)
Autumn Stephenson, Kimbery ([id number]) Visit Report for 02/04/2018 HPI Details Patient Name: Autumn Stephenson, Autumn Stephenson Date of Service: 02/04/2018 2:30 PM Medical Record Number: [id number] Patient Account Number: [id number] Date of Birth/Sex: 03/20/1991 (27 y.o. F) Treating RN: Huel CoventryWoody, Kim Primary Care Provider: SYSTEM, PROVIDER Other Clinician: Referring Provider: Elray BubaWASHO, Brycen Bean Treating Provider/Extender: Altamese CarolinaOBSON, Silvino Selman G Weeks in Treatment: 2 History of Present Illness HPI Description: 01/21/18-She is here initial evaluation for a right lateral thigh wound. She states she had an abscess, was using warm compress and it spontaneously ruptured 3-4 weeks ago. She has currently been applying mupirocin on daily. She has had 2 separate cycles of seven-day courses of Augmentin and doxycycline. There is no evidence of infection at today's visit. We will initiate hydrofera blue and she will follow up next week. She is voicing no c/o pain 01/28/18-She is here in follow-up evaluation for right lateral thigh wound. She did not tolerate Hydrofera Blue as expected and had some irritation. We will transition to [facility] and she will follow-up next week for anticipated she will be healed and able to be discharged 02/04/18; right lateral thigh wound that was initially an abscess that apparently spontaneously ruptured. The abscess part of this is all healed. She was using silver collagen. There is some expectation that this might be healed today Electronic Signature(s) Signed: 02/04/2018 5:35:06 PM By: Baltazar Najjarobson, Ottis Vacha MD Entered By: Baltazar Najjarobson, Ekam Bonebrake on 02/04/2018 16:04:24 Autumn Stephenson, Keith ([id number]) -------------------------------------------------------------------------------- Physical Exam Details Patient Name: Autumn Stephenson, Autumn Stephenson Date of Service: 02/04/2018 2:30 PM Medical Record Number: [id number] Patient Account Number: [id number] Date of Birth/Sex: 03/20/1991 (27 y.o. F) Treating RN: Huel CoventryWoody, Kim Primary Care Provider: SYSTEM, PROVIDER Other  Clinician: Referring Provider: Elray BubaWASHO, Tylia Ewell Treating Provider/Extender: Maxwell CaulOBSON, Hildegard Hlavac G Weeks in Treatment: 2 Constitutional Patient is hypotensive however she appears well. Pulse regular and within target range for patient.Marland Kitchen. Respirations regular, non- labored and within target range.. Temperature is normal and within the target range for the patient.Marland Kitchen. appears in no distress. Notes wound exam; right lateral thigh wound has 3 small open areas. Looking at the picture from last week there is been absolutely no change there is really no evidence of surrounding erythema or infection. The wound looks moist and I'll probably change the primary dressing the silver alginate Electronic Signature(s) Signed: 02/04/2018 5:35:06 PM By: Baltazar Najjarobson, Lashai Grosch MD Entered By: Baltazar Najjarobson, Tanay Massiah on 02/04/2018 16:05:41 Autumn Stephenson, Kiyah ([id number]) -------------------------------------------------------------------------------- Physician Orders Details Patient Name: Autumn Stephenson, Autumn Stephenson Date of Service: 02/04/2018 2:30 PM Medical Record Number: [id number] Patient Account Number: [id number] Date of Birth/Sex: 03/20/1991 (27 y.o. F) Treating RN: Curtis Sitesorthy, Joanna Primary Care Provider: SYSTEM, PROVIDER Other Clinician: Referring Provider: Elray BubaWASHO, Priscilla Kirstein Treating Provider/Extender: Altamese CarolinaOBSON, Piero Mustard G Weeks in Treatment: 2 Verbal / Phone Orders: No Diagnosis Coding Wound Cleansing Wound #1 Right,Lateral Upper Leg o Clean wound with Normal Saline. Anesthetic (add to Medication List) Wound #1 Right,Lateral Upper Leg o Topical Lidocaine 4% cream applied to wound bed prior to debridement (In Clinic Only). Primary Wound Dressing Wound #1 Right,Lateral Upper Leg o Silver Alginate Secondary Dressing Wound #1 Right,Lateral Upper Leg o Boardered Foam Dressing Dressing Change Frequency Wound #1 Right,Lateral Upper Leg o Change Dressing Monday, Wednesday, Friday Follow-up Appointments Wound #1 Right,Lateral Upper Leg o  Return Appointment in 1 week. Electronic Signature(s) Signed: 02/04/2018 5:18:29 PM By: Curtis Sitesorthy, Joanna Signed: 02/04/2018 5:35:06 PM By: Baltazar Najjarobson, Greta Yung MD Entered By: Curtis Sitesorthy, Joanna on 02/04/2018 15:27:30 Autumn Stephenson, Autumn Stephenson ([id number]) -------------------------------------------------------------------------------- Problem List Details Patient Name: Autumn Stephenson, Autumn Stephenson Date of Service: 02/04/2018 2:30 PM Medical Record Number: [id number] Patient Account Number:  [id number] Date of Birth/Sex: 07-21-1990 (27 y.o. 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 F) Treating RN: Huel Coventry Primary Care Provider: SYSTEM, PROVIDER Other Clinician: Referring Provider: Elray Buba Treating Provider/Extender: Maxwell Caul Weeks in Treatment: 2 Subjective History of Present Illness (HPI) 01/21/18-She is here initial evaluation for a right lateral thigh wound. She states she had an abscess, was using warm compress and it spontaneously ruptured 3-4 weeks ago. She has currently been applying mupirocin on daily. She has had 2 separate cycles of seven-day courses of  Augmentin and doxycycline. There is no evidence of infection at today's visit. We will initiate hydrofera blue and she will follow up next week. She is voicing no c/o pain 01/28/18-She is here in follow-up evaluation for right lateral thigh wound. She did not tolerate Hydrofera Blue as expected and had some irritation. We will transition to [facility] and she will follow-up next week for anticipated she will be healed and able to be discharged 02/04/18; right lateral thigh wound that was initially an abscess that apparently spontaneously ruptured. The abscess part of this is all healed. She was using silver collagen. There is some expectation that this might be healed today Objective Constitutional Patient is hypotensive however she appears well. Pulse regular and within target range for patient.Marland Kitchen Respirations regular, non- labored and within target range.. Temperature is normal and within the target range for the patient.Marland Kitchen appears in no distress. Vitals Time Taken: 2:50 PM, Height: 63 in, Weight: 117 lbs, BMI: 20.7, Temperature: 98.5 F, Pulse: 75 bpm, Respiratory Rate: 16 breaths/min, Blood Pressure: 96/62 mmHg. General Notes: wound exam; right lateral thigh wound has 3 small open areas. Looking at the picture from last week there is been absolutely no change there is really no evidence of surrounding erythema or infection. The wound looks moist and I'll probably change the primary dressing the silver alginate Integumentary (Hair, Skin) Wound #1 status is Open. Original cause of wound was Gradually Appeared. The wound is located on the Right,Lateral Upper Leg. The wound measures 1.7cm length x 1.6cm width x 0.2cm depth; 2.136cm^2 area and 0.427cm^3 volume. There is Fat Layer (Subcutaneous Tissue) Exposed exposed. There is no tunneling or undermining noted. There is a medium amount of serosanguineous drainage noted. The wound margin is distinct with the outline attached to the wound base. There is  medium (34-66%) red, pink granulation within the wound bed. There is no necrotic tissue within the wound bed. The periwound skin appearance exhibited: Scarring, Erythema. The periwound skin appearance did not exhibit: Callus, Crepitus, Excoriation, Induration, Rash, Dry/Scaly, Maceration, Atrophie Blanche, Cyanosis, Ecchymosis, Hemosiderin Staining, Mottled, Pallor, Rubor. The surrounding wound skin color is noted with erythema which is circumferential. Periwound temperature was noted as No Abnormality. The periwound has tenderness on palpation. 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 Anesthetic (add to Medication List): Wound #1 Right,Lateral Upper Leg: Topical Lidocaine 4% cream applied to wound bed prior to debridement (In Clinic Only). Primary Wound Dressing: Wound #1 Right,Lateral Upper Leg: Silver Alginate Secondary Dressing: Wound #1 Right,Lateral Upper Leg: Boardered Foam Dressing Dressing Change Frequency: Wound #1 Right,Lateral Upper Leg: Change Dressing Monday, Wednesday, Friday Follow-up Appointments: Wound #1 Right,Lateral Upper Leg: Return Appointment in 1 week. #1 the wound bed looks excessively moist and I'll change this to alginate #2 continue border foam #3 no evidence of infection Electronic Signature(s) Signed: 02/04/2018 5:35:06 PM By: Baltazar Najjar MD Entered By: Baltazar Najjar on 02/04/2018 16:06:07 Autumn Loud ([id number]) -------------------------------------------------------------------------------- SuperBill Details Patient Name: Autumn Loud Date of Service: 02/04/2018 Medical Record Number: [id number] Patient Account Number: [id number] Date of Birth/Sex: 04-03-1991 (26 y.o. 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

## 2018-02-18 ENCOUNTER — Ambulatory Visit: Payer: BLUE CROSS/BLUE SHIELD | Admitting: Internal Medicine

## 2018-02-25 ENCOUNTER — Encounter: Payer: BLUE CROSS/BLUE SHIELD | Attending: Internal Medicine | Admitting: Internal Medicine

## 2018-02-25 DIAGNOSIS — L02415 Cutaneous abscess of right lower limb: Secondary | ICD-10-CM | POA: Insufficient documentation

## 2018-02-27 NOTE — Progress Notes (Signed)
Autumn Stephenson ([id number]) Visit Report for 02/25/2018 Arrival Information Details Patient Name: Autumn Stephenson, Autumn Stephenson Date of Service: 02/25/2018 3:30 PM Medical Record Number: [id number] Patient Account Number: [id number] Date of Birth/Sex: [birth year] (26 y.o. F) Treating RN: Autumn Sites Primary Care Autumn Stephenson: Autumn Stephenson Other Clinician: Referring Autumn Stephenson: Autumn Stephenson Treating Autumn Stephenson/Extender: Autumn Stephenson in Treatment: 5 Visit Information History Since Last Visit Added or deleted any medications: No Patient Arrived: Ambulatory Any new allergies or adverse reactions: No Arrival Time: 15:38 Had a fall or experienced change in No Accompanied By: self activities of daily living that may affect Transfer Assistance: None risk of falls: Patient Identification Verified: Yes Signs or symptoms of abuse/neglect since last visito No Secondary Verification Process Completed: Yes Hospitalized since last visit: No Patient Requires Transmission-Based No Implantable device outside of the clinic excluding No Precautions: cellular tissue based products placed in the center Patient Has Alerts: No since last visit: Has Dressing in Place as Prescribed: No Pain Present Now: No Electronic Signature(s) Signed: 02/25/2018 4:54:10 PM By: Autumn Stephenson RCP, RRT, CHT Entered By: Autumn Stephenson on 02/25/2018 15:39:18 Autumn Stephenson ([id number]) -------------------------------------------------------------------------------- Clinic Level of Care Assessment Details Patient Name: Autumn Stephenson Date of Service: 02/25/2018 3:30 PM Medical Record Number: [id number] Patient Account Number: [id number] Date of Birth/Sex: [birth year] (26 y.o. F) Treating RN: Autumn Sites Primary Care Autumn Stephenson: Autumn Stephenson Other Clinician: Referring Autumn Stephenson: Autumn Stephenson Treating Autumn Stephenson/Extender: Autumn Stephenson in Treatment: 5 Clinic Level of Care Assessment Items TOOL 4  Quantity Score []  - Use when only an EandM is performed on FOLLOW-UP visit 0 ASSESSMENTS - Nursing Assessment / Reassessment X - Reassessment of Co-morbidities (includes updates in patient status) 1 10 X- 1 5 Reassessment of Adherence to Treatment Plan ASSESSMENTS - Wound and Skin Assessment / Reassessment X - Simple Wound Assessment / Reassessment - one wound 1 5 []  - 0 Complex Wound Assessment / Reassessment - multiple wounds []  - 0 Dermatologic / Skin Assessment (not related to wound area) ASSESSMENTS - Focused Assessment []  - Circumferential Edema Measurements - multi extremities 0 []  - 0 Nutritional Assessment / Counseling / Intervention []  - 0 Lower Extremity Assessment (monofilament, tuning fork, pulses) []  - 0 Peripheral Arterial Disease Assessment (using hand held doppler) ASSESSMENTS - Ostomy and/or Continence Assessment and Care []  - Incontinence Assessment and Management 0 []  - 0 Ostomy Care Assessment and Management (repouching, etc.) PROCESS - Coordination of Care X - Simple Patient / Family Education for ongoing care 1 15 []  - 0 Complex (extensive) Patient / Family Education for ongoing care []  - 0 Staff obtains Chiropractor, Records, Test Results / Process Orders []  - 0 Staff telephones HHA, Nursing Homes / Clarify orders / etc []  - 0 Routine Transfer to another Facility (non-emergent condition) []  - 0 Routine Hospital Admission (non-emergent condition) []  - 0 New Admissions / Manufacturing engineer / Ordering NPWT, Apligraf, etc. []  - 0 Emergency Hospital Admission (emergent condition) X- 1 10 Simple Discharge Coordination Autumn Stephenson, Autumn Stephenson ([id number]) []  - 0 Complex (extensive) Discharge Coordination PROCESS - Special Needs []  - Pediatric / Minor Patient Management 0 []  - 0 Isolation Patient Management []  - 0 Hearing / Language / Visual special needs []  - 0 Assessment of Community assistance (transportation, D/C planning, etc.) []  - 0 Additional  assistance / Altered mentation []  - 0 Support Surface(s) Assessment (bed, cushion, seat, etc.) INTERVENTIONS - Wound Cleansing / Measurement X - Simple Wound Cleansing - one wound 1 5 []  - 0 Complex Wound  Cleansing - multiple wounds X- 1 5 Wound Imaging (photographs - any number of wounds) []  - 0 Wound Tracing (instead of photographs) X- 1 5 Simple Wound Measurement - one wound []  - 0 Complex Wound Measurement - multiple wounds INTERVENTIONS - Wound Dressings []  - Small Wound Dressing one or multiple wounds 0 []  - 0 Medium Wound Dressing one or multiple wounds []  - 0 Large Wound Dressing one or multiple wounds []  - 0 Application of Medications - topical []  - 0 Application of Medications - injection INTERVENTIONS - Miscellaneous []  - External ear exam 0 []  - 0 Specimen Collection (cultures, biopsies, blood, body fluids, etc.) []  - 0 Specimen(s) / Culture(s) sent or taken to Lab for analysis []  - 0 Patient Transfer (multiple staff / Nurse, adultHoyer Lift / Similar devices) []  - 0 Simple Staple / Suture removal (25 or less) []  - 0 Complex Staple / Suture removal (26 or more) []  - 0 Hypo / Hyperglycemic Management (close monitor of Blood Glucose) []  - 0 Ankle / Brachial Index (ABI) - do not check if billed separately X- 1 5 Vital Signs Autumn Stephenson, Autumn Stephenson ([id number]) Has the patient been seen at the hospital within the last three years: Yes Total Score: 65 Level Of Care: New/Established - Level 2 Electronic Signature(s) Signed: 02/25/2018 5:20:22 PM By: Autumn Stephenson, Autumn Stephenson, Stephenson on 02/25/2018 15:51:43 Autumn Stephenson ([id number]) -------------------------------------------------------------------------------- Encounter Discharge Information Details Patient Name: Autumn Stephenson Date of Service: 02/25/2018 3:30 PM Medical Record Number: [id number] Patient Account Number: [id number] Date of Birth/Sex: [birth year] (26 y.o. F) Treating RN: Autumn Stephenson Primary Care Michol Emory:  Autumn Stephenson Other Clinician: Referring June Rode: Autumn BubaWASHO, MICHAEL Treating Aboubacar Matsuo/Extender: Autumn CarolinaOBSON, MICHAEL G Weeks in Treatment: 5 Encounter Discharge Information Items Discharge Condition: Stable Ambulatory Status: Ambulatory Discharge Destination: Home Transportation: Private Auto Accompanied By: self Schedule Follow-up Appointment: No Clinical Summary of Care: Electronic Signature(s) Signed: 02/25/2018 5:20:22 PM By: Autumn Stephenson, Autumn Stephenson, Stephenson on 02/25/2018 15:51:58 Autumn Stephenson ([id number]) -------------------------------------------------------------------------------- Lower Extremity Assessment Details Patient Name: Autumn Stephenson Date of Service: 02/25/2018 3:30 PM Medical Record Number: [id number] Patient Account Number: [id number] Date of Birth/Sex: [birth year] (26 y.o. F) Treating RN: Autumn Stephenson Primary Care Sherrise Liberto: SYSTEM, Paw Karstens Other Clinician: Referring Denell Cothern: Autumn BubaWASHO, MICHAEL Treating Rashiya Lofland/Extender: Maxwell CaulOBSON, MICHAEL G Weeks in Treatment: 5 Electronic Signature(s) Signed: 02/25/2018 3:43:44 PM By: Autumn Stephenson, Autumn Stephenson, Stephenson on 02/25/2018 15:43:44 Autumn Stephenson ([id number]) -------------------------------------------------------------------------------- Multi Wound Chart Details Patient Name: Autumn Stephenson Date of Service: 02/25/2018 3:30 PM Medical Record Number: [id number] Patient Account Number: [id number] Date of Birth/Sex: [birth year] (26 y.o. F) Treating RN: Autumn Stephenson Primary Care Lalania Haseman: SYSTEM, Jeray Shugart Other Clinician: Referring Leelynd Maldonado: Autumn BubaWASHO, MICHAEL Treating Kanyah Matsushima/Extender: Autumn CarolinaOBSON, MICHAEL G Weeks in Treatment: 5 Vital Signs Height(in): 63 Pulse(bpm): 108 Weight(lbs): 117 Blood Pressure(mmHg): 122/90 Body Mass Index(BMI): 21 Temperature(F): 98.2 Respiratory Rate 16 (breaths/min): Photos: [1:No Photos] [N/A:N/A] Wound Location: [1:Right, Lateral Upper Leg] [N/A:N/A] Wounding Event: [1:Gradually  Appeared] [N/A:N/A] Primary Etiology: [1:Abscess] [N/A:N/A] Date Acquired: [1:12/15/2017] [N/A:N/A] Weeks of Treatment: [1:5] [N/A:N/A] Wound Status: [1:Healed - Epithelialized] [N/A:N/A] Clustered Wound: [1:Yes] [N/A:N/A] Measurements L x W x D [1:0x0x0] [N/A:N/A] (cm) Area (cm) : [1:0] [N/A:N/A] Volume (cm) : [1:0] [N/A:N/A] % Reduction in Area: [1:100.00%] [N/A:N/A] % Reduction in Volume: [1:100.00%] [N/A:N/A] Classification: [1:Full Thickness Without Exposed Support Structures] [N/A:N/A] Periwound Skin Texture: [1:No Abnormalities Noted] [N/A:N/A] Periwound Skin Moisture: [1:No Abnormalities Noted] [N/A:N/A] Periwound Skin Color: [1:No Abnormalities Noted No] [N/A:N/A N/A] Treatment Notes Electronic Signature(s) Signed: 02/25/2018 5:30:50 PM By: Baltazar Najjarobson, Michael MD  Entered By: Baltazar Najjar on 02/25/2018 16:24:08 Autumn Stephenson ([id number]) -------------------------------------------------------------------------------- Multi-Disciplinary Care Plan Details Patient Name: Autumn Stephenson Date of Service: 02/25/2018 3:30 PM Medical Record Number: [id number] Patient Account Number: [id number] Date of Birth/Sex: 12-18-1990 (26 y.o. F) Treating RN: Autumn Sites Primary Care Eulla Kochanowski: SYSTEM, Kemyah Buser Other Clinician: Referring Deldrick Linch: Autumn Stephenson Treating Jaedah Lords/Extender: Autumn [place] in Treatment: 5 Active Inactive Electronic Signature(s) Signed: 02/25/2018 5:20:22 PM By: Autumn Sites Entered By: Autumn Sites on 02/25/2018 15:51:06 Autumn Stephenson ([id number]) -------------------------------------------------------------------------------- Pain Assessment Details Patient Name: Autumn Stephenson Date of Service: 02/25/2018 3:30 PM Medical Record Number: [id number] Patient Account Number: [id number] Date of Birth/Sex: Jan 04, 1991 (26 y.o. F) Treating RN: Autumn Sites Primary Care Kestrel Mis: SYSTEM, Aram Domzalski Other Clinician: Referring Hadley Detloff: Autumn Stephenson Treating  Alby Schwabe/Extender: Autumn [place] in Treatment: 5 Active Problems Location of Pain Severity and Description of Pain Patient Has Paino No Site Locations Pain Management and Medication Current Pain Management: Electronic Signature(s) Signed: 02/25/2018 4:54:10 PM By: Sallee Provencal, RRT, CHT Signed: 02/25/2018 5:20:22 PM By: Autumn Sites Entered By: Autumn Stephenson on 02/25/2018 15:39:26 Autumn Stephenson ([id number]) -------------------------------------------------------------------------------- Patient/Caregiver Education Details Patient Name: Autumn Stephenson Date of Service: 02/25/2018 3:30 PM Medical Record Number: [id number] Patient Account Number: [id number] Date of Birth/Gender: 18-Jul-1990 (26 y.o. F) Treating RN: Autumn Sites Primary Care Physician: SYSTEM, Joliana Claflin Other Clinician: Referring Physician: Elray Stephenson Treating Physician/Extender: Autumn [place] in Treatment: 5 Education Assessment Education Provided To: Patient Education Topics Provided Basic Hygiene: Handouts: Other: care of newly healed ulcer site Methods: Explain/Verbal Responses: State content correctly Electronic Signature(s) Signed: 02/25/2018 5:20:22 PM By: Autumn Sites Entered By: Autumn Sites on 02/25/2018 15:52:19 Autumn Stephenson ([id number]) -------------------------------------------------------------------------------- Wound Assessment Details Patient Name: Autumn Stephenson Date of Service: 02/25/2018 3:30 PM Medical Record Number: [id number] Patient Account Number: [id number] Date of Birth/Sex: March 02, 1991 (26 y.o. F) Treating RN: Autumn Sites Primary Care Scottlyn Mchaney: SYSTEM, Reilly Molchan Other Clinician: Referring Amedio Bowlby: Autumn Stephenson Treating Jeniah Kishi/Extender: Autumn [place] in Treatment: 5 Wound Status Wound Number: 1 Primary Etiology: Abscess Wound Location: Right, Lateral Upper Leg Wound Status: Healed - Epithelialized Wounding Event:  Gradually Appeared Date Acquired: 12/15/2017 Weeks Of Treatment: 5 Clustered Wound: Yes Wound Measurements Length: (cm) 0 Width: (cm) 0 Depth: (cm) 0 Area: (cm) 0 Volume: (cm) 0 % Reduction in Area: 100% % Reduction in Volume: 100% Wound Description Full Thickness Without Exposed Support Classification: Structures Periwound Skin Texture Texture Color No Abnormalities Noted: No No Abnormalities Noted: No Moisture No Abnormalities Noted: No Electronic Signature(s) Signed: 02/25/2018 5:20:22 PM By: Autumn Sites Entered By: Autumn Sites on 02/25/2018 15:50:55 Autumn Stephenson ([id number]) -------------------------------------------------------------------------------- Vitals Details Patient Name: Autumn Stephenson Date of Service: 02/25/2018 3:30 PM Medical Record Number: [id number] Patient Account Number: [id number] Date of Birth/Sex: 11/28/1990 (26 y.o. F) Treating RN: Autumn Sites Primary Care Kreig Parson: SYSTEM, Ellasyn Swilling Other Clinician: Referring Malai Lady: Autumn Stephenson Treating Aftin Lye/Extender: Autumn [place] in Treatment: 5 Vital Signs Time Taken: 15:35 Temperature (F): 98.2 Height (in): 63 Pulse (bpm): 108 Weight (lbs): 117 Respiratory Rate (breaths/min): 16 Body Mass Index (BMI): 20.7 Blood Pressure (mmHg): 122/90 Reference Range: 80 - 120 mg / dl Electronic Signature(s) Signed: 02/25/2018 4:54:10 PM By: Autumn Stephenson RCP, RRT, CHT Entered By: Autumn Stephenson on 02/25/2018 15:39:57

## 2018-02-27 NOTE — Progress Notes (Signed)
Autumn Stephenson ([id number]) Visit Report for 02/25/2018 HPI Details Patient Name: Autumn Stephenson, Autumn Stephenson Date of Service: 02/25/2018 3:30 PM Medical Record Number: [id number] Patient Account Number: [id number] Date of Birth/Sex: 1991-05-13 (27 y.o. F) Treating RN: Curtis Sites Primary Care Provider: SYSTEM, PROVIDER Other Clinician: Referring Provider: Elray Buba Treating Provider/Extender: Altamese [place] in Treatment: 5 History of Present Illness HPI Description: 01/21/18-She is here initial evaluation for a right lateral thigh wound. She states she had an abscess, was using warm compress and it spontaneously ruptured 3-4 weeks ago. She has currently been applying mupirocin on daily. She has had 2 separate cycles of seven-day courses of Augmentin and doxycycline. There is no evidence of infection at today's visit. We will initiate hydrofera blue and she will follow up next week. She is voicing no c/o pain 01/28/18-She is here in follow-up evaluation for right lateral thigh wound. She did not tolerate Hydrofera Blue as expected and had some irritation. We will transition to [facility] and she will follow-up next week for anticipated she will be healed and able to be discharged 02/04/18; right lateral thigh wound that was initially an abscess that apparently spontaneously ruptured. The abscess part of this is all healed. She was using silver collagen. There is some expectation that this might be healed today 02/12/18 on evaluation today patient's wound actually show signs of improvement in regard to the overall appearance of the ulceration. There does still appear to be a couple openings in the main/larger portion of the wound but fortunately nothing too significant. There's no evidence of infection at this point which is good news. 02/25/18; area on the right lateral thigh is closed. Still looks like a vulnerable area about in this young healthy woman I don't have any problems discharging her  today. Electronic Signature(s) Signed: 02/25/2018 5:30:50 PM By: Baltazar Najjar MD Entered By: Baltazar Najjar on 02/25/2018 16:24:45 Autumn Stephenson ([id number]) -------------------------------------------------------------------------------- Physical Exam Details Patient Name: Autumn Stephenson Date of Service: 02/25/2018 3:30 PM Medical Record Number: [id number] Patient Account Number: [id number] Date of Birth/Sex: February 02, 1991 (27 y.o. F) Treating RN: Curtis Sites Primary Care Provider: SYSTEM, PROVIDER Other Clinician: Referring Provider: Elray Buba Treating Provider/Extender: Maxwell Caul Weeks in Treatment: 5 Constitutional Sitting or standing Blood Pressure is within target range for patient.. Pulse regular and within target range for patient.Marland Kitchen Respirations regular, non-labored and within target range.. Temperature is normal and within the target range for the patient.Marland Kitchen appears in no distress. Integumentary (Hair, Skin) the patient also had a previous area just above the left antecubital fossa in the left arm.. Notes wound exam; area on the right lateral thigh is healed. She had 2 areas here both of these are closed Electronic Signature(s) Signed: 02/25/2018 5:30:50 PM By: Baltazar Najjar MD Entered By: Baltazar Najjar on 02/25/2018 16:26:14 Autumn Stephenson ([id number]) -------------------------------------------------------------------------------- Physician Orders Details Patient Name: Autumn Stephenson Date of Service: 02/25/2018 3:30 PM Medical Record Number: [id number] Patient Account Number: [id number] Date of Birth/Sex: January 04, 1991 (27 y.o. F) Treating RN: Curtis Sites Primary Care Provider: SYSTEM, PROVIDER Other Clinician: Referring Provider: Elray Buba Treating Provider/Extender: Altamese [place] in Treatment: 5 Verbal / Phone Orders: No Diagnosis Coding Discharge From [facility] o Discharge from Wound Care Center Electronic Signature(s) Signed: 02/25/2018  5:20:22 PM By: Curtis Sites Signed: 02/25/2018 5:30:50 PM By: Baltazar Najjar MD Entered By: Curtis Sites on 02/25/2018 15:51:21 Autumn Stephenson ([id number]) -------------------------------------------------------------------------------- Problem List Details Patient Name: Autumn Stephenson Date of Service: 02/25/2018 3:30 PM Medical Record Number: [id number] Patient Account Number:  [id number] Date of Birth/Sex: 1991-04-28 (27 y.o. 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 01/21/2018 No Yes of skin L02.415 Cutaneous abscess of right lower limb 01/21/2018 No Yes Inactive Problems Resolved Problems Electronic Signature(s) Signed: 02/25/2018 5:30:50 PM By: Baltazar Najjarobson, Keimani Laufer MD Entered By: Baltazar Najjarobson, Larrisha Babineau on 02/25/2018 16:24:01 Autumn LoudMASON, Alona ([id number]) -------------------------------------------------------------------------------- Progress Note Details Patient Name: Autumn Stephenson Date of Service: 02/25/2018 3:30 PM Medical Record Number: [id number] Patient Account Number: [id number] Date of Birth/Sex: 1991-04-28 (27 y.o. F) Treating RN: Curtis Sitesorthy, Joanna Primary Care Provider: SYSTEM, PROVIDER Other Clinician: Referring Provider: Elray BubaWASHO, Jaysion Ramseyer Treating Provider/Extender: Altamese CarolinaOBSON, Shunda Rabadi G Weeks in Treatment: 5 Subjective History of Present Illness (HPI) 01/21/18-She is here initial evaluation for a right lateral thigh wound. She states she had an abscess, was using warm compress and it spontaneously ruptured 3-4 weeks ago. She has currently been applying mupirocin on daily. She has had 2 separate cycles of seven-day courses of Augmentin and doxycycline. There is no evidence of infection at today's  visit. We will initiate hydrofera blue and she will follow up next week. She is voicing no c/o pain 01/28/18-She is here in follow-up evaluation for right lateral thigh wound. She did not tolerate Hydrofera Blue as expected and had some irritation. We will transition to [facility] and she will follow-up next week for anticipated she will be healed and able to be discharged 02/04/18; right lateral thigh wound that was initially an abscess that apparently spontaneously ruptured. The abscess part of this is all healed. She was using silver collagen. There is some expectation that this might be healed today 02/12/18 on evaluation today patient's wound actually show signs of improvement in regard to the overall appearance of the ulceration. There does still appear to be a couple openings in the main/larger portion of the wound but fortunately nothing too significant. There's no evidence of infection at this point which is good news. 02/25/18; area on the right lateral thigh is closed. Still looks like a vulnerable area about in this young healthy woman I don't have any problems discharging her today. Objective Constitutional Sitting or standing Blood Pressure is within target range for patient.. Pulse regular and within target range for patient.Marland Kitchen. Respirations regular, non-labored and within target range.. Temperature is normal and within the target range for the patient.Marland Kitchen. appears in no distress. Vitals Time Taken: 3:35 PM, Height: 63 in, Weight: 117 lbs, BMI: 20.7, Temperature: 98.2 F, Pulse: 108 bpm, Respiratory Rate: 16 breaths/min, Blood Pressure: 122/90 mmHg. General Notes: wound exam; area on the right lateral thigh is healed. She had 2 areas here both of these are closed Integumentary (Hair, Skin) the patient also had a previous area just above the left antecubital fossa in the left arm.. Wound #1 status is Healed - Epithelialized. Original cause of wound was Gradually Appeared. The wound is located  on the Right,Lateral Upper Leg. The wound measures 0cm length x 0cm width x 0cm depth; 0cm^2 area and 0cm^3 volume. 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 Cutaneous abscess of right lower limb [facility] from [facility]1 the patient can be discharged Electronic Signature(s) Signed: 02/25/2018 5:30:50 PM By: Baltazar Najjarobson, Vuong Musa MD Entered By: Baltazar Najjarobson, Mable Dara on 02/25/2018  16:26:38 YANI, COVENTRY ([id number]) -------------------------------------------------------------------------------- SuperBill Details Patient Name: TANEKIA, RYANS Date of Service: 02/25/2018 Medical Record Number: [id number] Patient Account Number: [id number] Date of Birth/Sex: December 10, 1990 (27 y.o. 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

## 2018-03-04 ENCOUNTER — Ambulatory Visit: Payer: BLUE CROSS/BLUE SHIELD | Admitting: Internal Medicine

## 2019-02-26 ENCOUNTER — Other Ambulatory Visit: Payer: Self-pay

## 2019-02-26 DIAGNOSIS — Z20822 Contact with and (suspected) exposure to covid-19: Secondary | ICD-10-CM

## 2019-02-28 LAB — NOVEL CORONAVIRUS, NAA: SARS-CoV-2, NAA: NOT DETECTED

## 2019-06-08 DIAGNOSIS — G4700 Insomnia, unspecified: Secondary | ICD-10-CM | POA: Insufficient documentation

## 2019-06-08 DIAGNOSIS — Z1371 Encounter for nonprocreative screening for genetic disease carrier status: Secondary | ICD-10-CM | POA: Insufficient documentation

## 2020-09-12 ENCOUNTER — Encounter (HOSPITAL_COMMUNITY)
Admission: RE | Disposition: A | Discharge status: Home / Self Care | Payer: Self-pay | Attending: Obstetrics & Gynecology

## 2020-09-12 ENCOUNTER — Ambulatory Visit (HOSPITAL_COMMUNITY)
Admission: RE | Admit: 2020-09-12 | Discharge: 2020-09-12 | Disposition: A | Discharge status: Home / Self Care | Payer: BC Managed Care – PPO | Source: Other Acute Inpatient Hospital | Attending: Obstetrics & Gynecology | Admitting: Obstetrics & Gynecology

## 2020-09-12 ENCOUNTER — Ambulatory Visit (HOSPITAL_COMMUNITY): Payer: BC Managed Care – PPO | Admitting: Anesthesiology

## 2020-09-12 ENCOUNTER — Other Ambulatory Visit: Payer: Self-pay | Admitting: Obstetrics & Gynecology

## 2020-09-12 ENCOUNTER — Other Ambulatory Visit: Payer: Self-pay

## 2020-09-12 ENCOUNTER — Encounter (HOSPITAL_COMMUNITY): Payer: Self-pay | Admitting: Obstetrics & Gynecology

## 2020-09-12 DIAGNOSIS — Z20822 Contact with and (suspected) exposure to covid-19: Secondary | ICD-10-CM | POA: Diagnosis not present

## 2020-09-12 DIAGNOSIS — O034 Incomplete spontaneous abortion without complication: Secondary | ICD-10-CM | POA: Diagnosis present

## 2020-09-12 DIAGNOSIS — Z833 Family history of diabetes mellitus: Secondary | ICD-10-CM | POA: Diagnosis not present

## 2020-09-12 DIAGNOSIS — Z803 Family history of malignant neoplasm of breast: Secondary | ICD-10-CM | POA: Insufficient documentation

## 2020-09-12 DIAGNOSIS — Z79899 Other long term (current) drug therapy: Secondary | ICD-10-CM | POA: Insufficient documentation

## 2020-09-12 DIAGNOSIS — O020 Blighted ovum and nonhydatidiform mole: Secondary | ICD-10-CM

## 2020-09-12 DIAGNOSIS — F17210 Nicotine dependence, cigarettes, uncomplicated: Secondary | ICD-10-CM | POA: Diagnosis not present

## 2020-09-12 HISTORY — PX: DILATION AND EVACUATION: SHX1459

## 2020-09-12 LAB — TYPE AND SCREEN
ABO/RH(D): A POS
Antibody Screen: NEGATIVE

## 2020-09-12 LAB — CBC
HCT: 36 % (ref 36.0–46.0)
Hemoglobin: 12 g/dL (ref 12.0–15.0)
MCH: 29.6 pg (ref 26.0–34.0)
MCHC: 33.3 g/dL (ref 30.0–36.0)
MCV: 88.7 fL (ref 80.0–100.0)
Platelets: 258 10*3/uL (ref 150–400)
RBC: 4.06 MIL/uL (ref 3.87–5.11)
RDW: 12.5 % (ref 11.5–15.5)
WBC: 4.8 10*3/uL (ref 4.0–10.5)
nRBC: 0 % (ref 0.0–0.2)

## 2020-09-12 LAB — ABO/RH: ABO/RH(D): A POS

## 2020-09-12 LAB — SARS CORONAVIRUS 2 BY RT PCR (HOSPITAL ORDER, PERFORMED IN ~~LOC~~ HOSPITAL LAB): SARS Coronavirus 2: NEGATIVE

## 2020-09-12 SURGERY — DILATION AND EVACUATION, UTERUS
Anesthesia: Monitor Anesthesia Care

## 2020-09-12 MED ORDER — PROPOFOL 10 MG/ML IV BOLUS
INTRAVENOUS | Status: AC
  Filled 2020-09-12: qty 20

## 2020-09-12 MED ORDER — LIDOCAINE HCL (CARDIAC) PF 100 MG/5ML IV SOSY
PREFILLED_SYRINGE | INTRAVENOUS | Status: DC | PRN
  Administered 2020-09-12: 50 mg via INTRAVENOUS

## 2020-09-12 MED ORDER — IBUPROFEN 800 MG PO TABS: 800.0000 mg | ORAL_TABLET | Freq: Three times a day (TID) | ORAL | Status: DC | PRN

## 2020-09-12 MED ORDER — ORAL CARE MOUTH RINSE: 15.0000 mL | Freq: Once | OROMUCOSAL | Status: AC

## 2020-09-12 MED ORDER — OXYCODONE HCL 5 MG/5ML PO SOLN: 5.0000 mg | Freq: Once | ORAL | Status: DC | PRN

## 2020-09-12 MED ORDER — ONDANSETRON HCL 4 MG/2ML IJ SOLN: 4.0000 mg | Freq: Once | INTRAMUSCULAR | Status: DC | PRN

## 2020-09-12 MED ORDER — AMISULPRIDE (ANTIEMETIC) 5 MG/2ML IV SOLN: 10.0000 mg | Freq: Once | INTRAVENOUS | Status: DC | PRN

## 2020-09-12 MED ORDER — DOXYCYCLINE HYCLATE 100 MG IV SOLR
200.0000 mg | Freq: Once | INTRAVENOUS | Status: DC
  Filled 2020-09-12: qty 200

## 2020-09-12 MED ORDER — OXYCODONE-ACETAMINOPHEN 5-325 MG PO TABS: 1.0000 | ORAL_TABLET | ORAL | Status: DC | PRN

## 2020-09-12 MED ORDER — BUPIVACAINE-EPINEPHRINE 0.5% -1:200000 IJ SOLN
INTRAMUSCULAR | Status: DC | PRN
  Administered 2020-09-12: 10 mL

## 2020-09-12 MED ORDER — KETOROLAC TROMETHAMINE 30 MG/ML IJ SOLN
INTRAMUSCULAR | Status: AC
  Filled 2020-09-12: qty 1

## 2020-09-12 MED ORDER — FENTANYL CITRATE (PF) 100 MCG/2ML IJ SOLN: 25.0000 ug | INTRAMUSCULAR | Status: DC | PRN

## 2020-09-12 MED ORDER — LACTATED RINGERS IV SOLN: INTRAVENOUS | Status: DC

## 2020-09-12 MED ORDER — POVIDONE-IODINE 10 % EX SWAB: 2.0000 "application " | Freq: Once | CUTANEOUS | Status: DC

## 2020-09-12 MED ORDER — FENTANYL CITRATE (PF) 100 MCG/2ML IJ SOLN
INTRAMUSCULAR | Status: DC | PRN
  Administered 2020-09-12: 50 ug via INTRAVENOUS

## 2020-09-12 MED ORDER — IBUPROFEN 800 MG PO TABS: 800.0000 mg | ORAL_TABLET | Freq: Three times a day (TID) | ORAL | 0 refills | Status: DC | PRN

## 2020-09-12 MED ORDER — PROPOFOL 500 MG/50ML IV EMUL
INTRAVENOUS | Status: DC | PRN
  Administered 2020-09-12: 100 ug/kg/min via INTRAVENOUS

## 2020-09-12 MED ORDER — METHYLERGONOVINE MALEATE 0.2 MG/ML IJ SOLN
INTRAMUSCULAR | Status: AC
  Filled 2020-09-12: qty 1

## 2020-09-12 MED ORDER — OXYCODONE-ACETAMINOPHEN 5-325 MG PO TABS: 1.0000 | ORAL_TABLET | ORAL | 0 refills | Status: DC | PRN

## 2020-09-12 MED ORDER — CHLORHEXIDINE GLUCONATE 0.12 % MT SOLN
OROMUCOSAL | Status: AC
  Administered 2020-09-12: 15 mL via OROMUCOSAL
  Filled 2020-09-12: qty 15

## 2020-09-12 MED ORDER — MIDAZOLAM HCL 2 MG/2ML IJ SOLN
INTRAMUSCULAR | Status: AC
  Filled 2020-09-12: qty 2

## 2020-09-12 MED ORDER — CHLORHEXIDINE GLUCONATE 0.12 % MT SOLN: 15.0000 mL | Freq: Once | OROMUCOSAL | Status: AC

## 2020-09-12 MED ORDER — FENTANYL CITRATE (PF) 250 MCG/5ML IJ SOLN
INTRAMUSCULAR | Status: AC
  Filled 2020-09-12: qty 5

## 2020-09-12 MED ORDER — KETOROLAC TROMETHAMINE 30 MG/ML IJ SOLN
30.0000 mg | Freq: Once | INTRAMUSCULAR | Status: AC
  Administered 2020-09-12: 30 mg via INTRAVENOUS

## 2020-09-12 MED ORDER — BUPIVACAINE-EPINEPHRINE 0.5% -1:200000 IJ SOLN
INTRAMUSCULAR | Status: AC
  Filled 2020-09-12: qty 1

## 2020-09-12 MED ORDER — PROPOFOL 10 MG/ML IV BOLUS
INTRAVENOUS | Status: DC | PRN
  Administered 2020-09-12: 10 mg via INTRAVENOUS

## 2020-09-12 MED ORDER — OXYCODONE HCL 5 MG PO TABS: 5.0000 mg | ORAL_TABLET | Freq: Once | ORAL | Status: DC | PRN

## 2020-09-12 MED ORDER — MIDAZOLAM HCL 2 MG/2ML IJ SOLN
INTRAMUSCULAR | Status: DC | PRN
  Administered 2020-09-12: 2 mg via INTRAVENOUS

## 2020-09-12 SURGICAL SUPPLY — 20 items
CATH ROBINSON RED A/P 16FR (CATHETERS) ×2 IMPLANT
DECANTER SPIKE VIAL GLASS SM (MISCELLANEOUS) ×2 IMPLANT
FILTER UTR ASPR ASSEMBLY (MISCELLANEOUS) ×2 IMPLANT
GLOVE BIO SURGEON STRL SZ 6.5 (GLOVE) ×2 IMPLANT
GLOVE SURG UNDER POLY LF SZ7 (GLOVE) ×4 IMPLANT
GOWN STRL REUS W/ TWL LRG LVL3 (GOWN DISPOSABLE) ×2 IMPLANT
GOWN STRL REUS W/TWL LRG LVL3 (GOWN DISPOSABLE) ×2
HOSE CONNECTING 18IN BERKELEY (TUBING) ×2 IMPLANT
KIT BERKELEY 1ST TRI 3/8 NO TR (MISCELLANEOUS) ×2 IMPLANT
KIT BERKELEY 1ST TRIMESTER 3/8 (MISCELLANEOUS) ×2 IMPLANT
NS IRRIG 1000ML POUR BTL (IV SOLUTION) ×2 IMPLANT
PACK VAGINAL MINOR WOMEN LF (CUSTOM PROCEDURE TRAY) ×2 IMPLANT
PAD OB MATERNITY 4.3X12.25 (PERSONAL CARE ITEMS) ×2 IMPLANT
SET BERKELEY SUCTION TUBING (SUCTIONS) ×2 IMPLANT
TOWEL GREEN STERILE FF (TOWEL DISPOSABLE) ×4 IMPLANT
UNDERPAD 30X36 HEAVY ABSORB (UNDERPADS AND DIAPERS) ×2 IMPLANT
VACURETTE 10 RIGID CVD (CANNULA) IMPLANT
VACURETTE 7MM CVD STRL WRAP (CANNULA) IMPLANT
VACURETTE 8 RIGID CVD (CANNULA) ×2 IMPLANT
VACURETTE 9 RIGID CVD (CANNULA) IMPLANT

## 2020-09-12 NOTE — Interval H&P Note (Signed)
History and Physical Interval Note:  09/12/2020 1:55 PM  Autumn Stephenson  has presented today for surgery, with the diagnosis of Retained POC.  The various methods of treatment have been discussed with the patient and family. After consideration of risks, benefits and other options for treatment, the patient has consented to  SUCTION DILATION AND CURETTAGE as a surgical intervention.  The patient's history has been reviewed, patient examined, no change in status, stable for surgery.  I have reviewed the patient's chart and labs.  Questions were answered to the patient's satisfaction.    Prescilla Sours, MD.   09/12/2020.

## 2020-09-12 NOTE — Anesthesia Postprocedure Evaluation (Signed)
Anesthesia Post Note  Patient: Autumn Stephenson  Procedure(s) Performed: DILATATION AND EVACUATION (N/A )     Patient location during evaluation: PACU Anesthesia Type: MAC Level of consciousness: awake and alert Pain management: pain level controlled Vital Signs Assessment: post-procedure vital signs reviewed and stable Respiratory status: spontaneous breathing, nonlabored ventilation and respiratory function stable Cardiovascular status: blood pressure returned to baseline and stable Postop Assessment: no apparent nausea or vomiting Anesthetic complications: no   No complications documented.  Last Vitals:  Vitals:   09/12/20 1509 09/12/20 1511  BP: 107/73   Pulse: (!) 53 (!) 54  Resp: 10 14  Temp:  36.6 C  SpO2: 100% 100%    Last Pain:  Vitals:   09/12/20 1439  TempSrc:   PainSc: 0-No pain                 Lidia Collum

## 2020-09-12 NOTE — Anesthesia Procedure Notes (Signed)
Procedure Name: MAC Date/Time: 09/12/2020 1:56 PM Performed by: Mariea Clonts, CRNA Pre-anesthesia Checklist: Patient identified, Emergency Drugs available, Suction available, Patient being monitored and Timeout performed Patient Re-evaluated:Patient Re-evaluated prior to induction Oxygen Delivery Method: Simple face mask

## 2020-09-12 NOTE — H&P (Signed)
Autumn Stephenson is a 30 y.o. female P: 0-0-1-0 presents for  dilatation and curettage because of retained products of conception. The patient's last menstrual period was 05/08/2020.  The patient was seen at [facility] on July 18, 2020 where her ultrasound revealed a single empty intrauterine gestational sac with irregular borders and internal debris measuring 8 weeks and 4 days.  The patient chose expectant management at that time. After 3 weeks August 14, 2020,  however, since she had not had any evidence of spontaneous expulsion, she proceeded with Cytotec to expedite the emptying of uterine contents. The patient reported  intense cramping and heavy bleeding yielding clots and tissue for about 24 hours after administration of Cytotec.  The then had a tapering of her bleeding.  She denies any problems with urination, changes in bowel function, fever or dizziness.  One March 22 nd the patient reported intermittent cramping rated 7/10 with some relief with Ibuprofen accompanied by vaginal bleeding requiring the change of a pad every 2-3 hours.  A pelvic ultrasound at that time showed likely retained products of conception-3.2 cm with multiple cysts and areas of mobile debris within the uterine cavity.  The patient was then placed on Methergine 0. 2 mg every 6 hours for 24 hours, however,  she did not have any response from this therapy.  Given her failed medical management of this condition the patient has consented to proceed with surgical management in the form of dilatation and curettage.   Past Medical History  OB History: G:1;  P:0-0-1-0 (current blighted ovum with retained products of conception)  GYN History: menarche: 30 YO;  LMP: 05/08/2020;    Contraception: none;   Denies history of abnormal PAP smear  Last PAP smear: 2021-normal  Medical History: Asthma, ADD, HSV-2 and a Single Episode of a Seizure  Surgical History: 1999 Tonsilectomy; 2010 Shoulder Surgery Blood Type A  +   Family History: Breast Cancer and Diabetes Mellitus  Social History: Engaged and Employed as a Runner, broadcasting/film/video; Smokes 1 pack of cigarettes a week and no alcohol   Medications: Vyvanse 50 mg daliy Quetiapine 100 mg daily (qhs) Doxycycline 100 mg bid   No Known Allergies  ROS: Admits to cramping and irregular vaginal bleeding (see HPI) but  denies headache, vision changes, nasal congestion, dysphagia, tinnitus, dizziness, hoarseness, cough,  chest pain, shortness of breath, nausea, vomiting, diarrhea,constipation,  urinary frequency, urgency  dysuria, hematuria, vaginitis symptoms,  swelling of joints,easy bruising,  myalgias, arthralgias, skin rashes, unexplained weight loss and except as is mentioned in the history of present illness, patient's review of systems is otherwise negative.     Physical Exam  Bp: 100/60;  Weight: 119 lbs.;  Height: 5'2";  BMI: 21.8  Neck: supple without masses or thyromegaly Lungs: clear to auscultation Heart: regular rate and rhythm Abdomen: soft, non-tender and no organomegaly Pelvic:EGBUS- wnl; vagina-normal rugae with scant brown discharge uterus-normal size, cervix without lesions or motion tenderness; adnexae-no tenderness or masses Extremities:  no clubbing, cyanosis or edema   Assesment: Retained Products of                      Conception                       Blighted Ovum   Disposition:  A discussion was held with patient regarding the indication for her procedure(s) along with the risks, which include but are not limited to: reaction to anesthesia,  damage to adjacent organs  (to include uterine perforation and intrauterine scarring)  infection and excessive bleeding. The patient verbalized understanding of these risks and has consented to proceed with Dilatation and Curettage at [facility]  on September 12, 2020.   CSN# [id number]   Meshulem Onorato J. Lowell Guitar, PA-C  for Dr. Hoover Browns

## 2020-09-12 NOTE — Transfer of Care (Signed)
Immediate Anesthesia Transfer of Care Note  Patient: Autumn Stephenson  Procedure(s) Performed: DILATATION AND EVACUATION (N/A )  Patient Location: PACU  Anesthesia Type:MAC  Level of Consciousness: awake, alert  and oriented  Airway & Oxygen Therapy: Patient Spontanous Breathing  Post-op Assessment: Report given to RN, Post -op Vital signs reviewed and stable and Patient moving all extremities X 4  Post vital signs: Reviewed and stable  Last Vitals:  Vitals Value Taken Time  BP 98/71 09/12/20 1439  Temp    Pulse 67 09/12/20 1440  Resp 17 09/12/20 1440  SpO2 100 % 09/12/20 1440  Vitals shown include unvalidated device data.  Last Pain:  Vitals:   09/12/20 1210  TempSrc:   PainSc: 0-No pain         Complications: No complications documented.

## 2020-09-12 NOTE — Progress Notes (Signed)
Patient's jewelry (rings, necklace, watch, earrings) taken to husband at main entrance.  Patient's husband verified patient's name and date of birth and accepted jewelry.

## 2020-09-12 NOTE — Op Note (Signed)
Patient: Autumn Stephenson. Venkatesh Date of birth: 08-29-1990 MRN: [id number]  Date of procedure: 09/12/2020  Preop diagnosis: 1. Retained products of conception.  2. Failed medical management of anembryonic pregnancy.   Postop diagnosis: Same as above.    Procedure:  1. Suction dilation and curettage.   Surgeon: Dr. Hoover Browns.  Assistants: None.   Anesthesia: Monitored anesthesia care  Complications: None  Input: per anesthesia.   Output: EBL: 50cc including products of conception.    Urine: Straight catheterization.   Findings: Uterus 6 to 8 week sized. No palpable adnexal masses bilaterally. Cervix closed with no blood coming through the os.  Indications: 30 year-old G1P0010 with retained products of conception after a failed medical management of anembryonic pregnancy here for a suction dilation and curettage. She was consented for the procedure after explaining the risks, benefits and alternatives of the procedure including risks of bleeding, infection, damage to organs, uterine scarring and Asherman's syndrome.  She was a known blood group A pos.      Procedure: She was taken to the operating room anesthesia was administered without difficulty. Patient had already taken oral doxycyline at home and has several more doses of doxycycline to complete.  She was placed in the dorsal lithotomy position and prepped and draped in the usual sterile fashion.   Straight catheterization was performed.  Graves speculum was placed in and cervix grasped with ring forcep anteriorly. 0.5% marcaine with epinephrine was given as a paracervical block, total 10 cc.  Cervix was dilated to the # 21 pratt.  # 8 suction currett advanced through cervix to fundus and electric suction initiated to green zone, 600 mm Hg.  Three passes were made.  Gentle sharp currettage was done and uterine walls noted to be gritty. Suction was done one more time.  Excellent hemostasis was noted.  The instruments were then removed  and the patient was then awoken from anesthesia she was cleaned and taken to recovery room in stable condition.  Specimen: Products of conception.   Dr. Hoover Browns, MD. 09/12/2020.

## 2020-09-12 NOTE — Discharge Instructions (Signed)
Ms. Manasa, 1. Nothing in vagina x 2 weeks: no sex, no tampons, no douching x 2 weeks.   2. Expect some vaginal bleeding for next several days, call me if with excessive bleeding requiring you to change a pad every hour.  3.  Expect some abdominal cramping, take pain medication as needed.  Call me if pain is intolerable despite medication use. 4. Please call office to make an appointment to see me in 2 weeks for post procedure check. 5. You may return to work in 2 days.  Sincerely, Dr. Sallye Ober.  [phone].

## 2020-09-12 NOTE — Anesthesia Preprocedure Evaluation (Signed)
Anesthesia Evaluation  Patient identified by MRN, date of birth, ID band Patient awake    Reviewed: Allergy & Precautions, NPO status , Patient's Chart, lab work & pertinent test results  History of Anesthesia Complications Negative for: history of anesthetic complications  Airway Mallampati: I  TM Distance: >3 FB Neck ROM: Full    Dental  (+) Teeth Intact   Pulmonary asthma , Current Smoker,    Pulmonary exam normal        Cardiovascular negative cardio ROS Normal cardiovascular exam     Neuro/Psych negative neurological ROS     GI/Hepatic negative GI ROS, Neg liver ROS,   Endo/Other  negative endocrine ROS  Renal/GU negative Renal ROS  negative genitourinary   Musculoskeletal negative musculoskeletal ROS (+)   Abdominal   Peds  Hematology negative hematology ROS (+)   Anesthesia Other Findings   Reproductive/Obstetrics (+) Pregnancy (retained POC)                           Anesthesia Physical Anesthesia Plan  ASA: II  Anesthesia Plan: MAC   Post-op Pain Management:    Induction: Intravenous  PONV Risk Score and Plan: 3 and Propofol infusion, TIVA and Treatment may vary due to age or medical condition  Airway Management Planned: Natural Airway, Nasal Cannula and Simple Face Mask  Additional Equipment: None  Intra-op Plan:   Post-operative Plan:   Informed Consent: I have reviewed the patients History and Physical, chart, labs and discussed the procedure including the risks, benefits and alternatives for the proposed anesthesia with the patient or authorized representative who has indicated his/her understanding and acceptance.       Plan Discussed with:   Anesthesia Plan Comments:         Anesthesia Quick Evaluation

## 2020-09-13 ENCOUNTER — Encounter (HOSPITAL_COMMUNITY): Payer: Self-pay | Admitting: Obstetrics & Gynecology

## 2020-09-13 LAB — SURGICAL PATHOLOGY

## 2021-01-18 ENCOUNTER — Ambulatory Visit: Payer: 59 | Admitting: Family Medicine

## 2021-01-18 ENCOUNTER — Other Ambulatory Visit: Payer: Self-pay

## 2021-01-18 ENCOUNTER — Ambulatory Visit (INDEPENDENT_AMBULATORY_CARE_PROVIDER_SITE_OTHER): Payer: 59

## 2021-01-18 VITALS — BP 122/84 | HR 131 | Ht 62.0 in | Wt 118.6 lb

## 2021-01-18 DIAGNOSIS — M25562 Pain in left knee: Secondary | ICD-10-CM

## 2021-01-18 NOTE — Patient Instructions (Addendum)
Thank you for coming in today.   Please get an Xray today before you leave   You should hear from MRI scheduling within 1 week. If you do not hear please let me know.    Recheck after the MRI.    

## 2021-01-18 NOTE — Progress Notes (Signed)
I, Autumn Stephenson, LAT, ATC acting as a scribe for Autumn Graham, MD.  Subjective:    CC: L knee pain  HPI: Pt is a 30 y/o female c/o L knee pain x 2 years. Pt was previously seen at [facility]. Pt reports it feels like her L leg is 10-20 lbs heavier. Pt had a previous trial of PT x10 visits. MOI: Pt fx her patella in 2012, but did not have surgery. Pt locates pain to L calf and anterior aspect of L knee. Pt reports there was a large amount of swelling in the posterior aspect of her knee and then one day the fluid moved into her lower leg.  Knee swelling: yes- calf Radiating pain: no Mechanical symptoms: yes Aggravates: walking Treatments tried: PT, HEP  Pertinent review of Systems: No fevers or chills  Relevant historical information: Insomnia   Objective:    Vitals:   01/18/21 1535  BP: 122/84  Pulse: (!) 131  SpO2: 99%   General: Well Developed, well nourished, and in no acute distress.   MSK: Left knee normal. Normal motion with minimal crepitation. Tender palpation medial joint line. Stable ligamentous exam. Positive medial McMurray's test. Intact strength.  Left calf normal.  No edema normal motion and strength.  Lab and Radiology Results  Diagnostic Limited MSK Ultrasound of: Left knee left calf Quad tendon intact normal-appearing Patellar tendon normal-appearing Medial joint line slightly narrowed but otherwise normal. Lateral joint line normal. Posterior knee normal-appearing .  Left calf normal-appearing with no edema Impression: Normal-appearing knee and calf   EMG [facility] 04/19/20 NCV & EMG Findings: All nerve conduction studies (as indicated in the following tables) were within normal limits. All left vs. right side differences were within normal limits.   EMG: see table  Impression: 1. This is a normal study. There is no electrophysiologic evidence of left leg neuropathy or radiculopathy 2. No evidence of right leg neuropathy  proximal to the ankle.  _____________________________ Autumn Stephenson, M.D. Board Certified in Electrodiagnostic Medicine   X-ray images left knee obtained today personally independently interpreted Normal.  No fractures or significant degenerative changes. Await formal radiology review  Impression and Recommendations:    Assessment and Plan: 30 y.o. female with left knee and calf pain and dysfunction.  Etiology unclear. Autumn Stephenson has had an extensive work-up and treatment trial with good trial of physical therapy and evaluation with an x-ray previously at orthopedics and a trial of a nerve conduction study all of which have been unrevealing.  She complains of some knee pain and swelling and a sensation that her calf is swollen or heavy 8 hours not working right.  It is possible she did have a ruptured Baker's cyst previously but I do not see evidence of a current Baker's cyst now.  Plan to further evaluate the knee with an MRI.  If this is normal would consider looking at her back for lumbar radiculopathy with a lumbar MRI.Marland Kitchen  Recheck after MRI  PDMP not reviewed this encounter. Orders Placed This Encounter  Procedures   DG Knee AP/LAT W/Sunrise Left    Standing Status:   Future    Number of Occurrences:   1    Standing Expiration Date:   01/18/2022    Order Specific Question:   Reason for Exam (SYMPTOM  OR DIAGNOSIS REQUIRED)    Answer:   left knee pain    Order Specific Question:   Preferred imaging location?    Answer:   [facility]  Order Specific Question:   Is patient pregnant?    Answer:   No   [place] LIMITED JOINT SPACE STRUCTURES LOW LEFT(NO LINKED CHARGES)    Order Specific Question:   Reason for Exam (SYMPTOM  OR DIAGNOSIS REQUIRED)    Answer:   left knee pain    Order Specific Question:   Preferred imaging location?    Answer:   [facility]   MR Knee Left  Wo Contrast    Standing Status:   Future    Standing Expiration Date:   01/18/2022    Order  Specific Question:   What is the patient's sedation requirement?    Answer:   No Sedation    Order Specific Question:   Does the patient have a pacemaker or implanted devices?    Answer:   No    Order Specific Question:   Preferred imaging location?    Answer:   [facility] (table limit-350lbs)   No orders of the defined types were placed in this encounter.   Discussed warning signs or symptoms. Please see discharge instructions. Patient expresses understanding.   The above documentation has been reviewed and is accurate and complete Autumn Stephenson, M.D.

## 2021-01-22 NOTE — Progress Notes (Signed)
Left knee x-ray looks normal to radiology

## 2021-01-29 ENCOUNTER — Telehealth: Payer: Self-pay | Admitting: *Deleted

## 2021-01-29 NOTE — Telephone Encounter (Signed)
MRI L knee w/o was denied by insurance due to pt not having at least 6 wks of conservative therapy. Do you want me to setup a peer-to-peer?

## 2021-01-30 NOTE — Telephone Encounter (Signed)
Yes please arrange for peer to peer, and inform patient.

## 2021-02-06 ENCOUNTER — Telehealth: Payer: Self-pay | Admitting: Family Medicine

## 2021-02-06 NOTE — Telephone Encounter (Signed)
Peer to peer completed today.  Authorization valid until February 21 July 2021  Authorization number [id number]

## 2021-02-12 ENCOUNTER — Ambulatory Visit (INDEPENDENT_AMBULATORY_CARE_PROVIDER_SITE_OTHER): Payer: 59

## 2021-02-12 ENCOUNTER — Other Ambulatory Visit: Payer: Self-pay

## 2021-02-12 DIAGNOSIS — M25562 Pain in left knee: Secondary | ICD-10-CM | POA: Diagnosis not present

## 2021-02-12 DIAGNOSIS — G8929 Other chronic pain: Secondary | ICD-10-CM | POA: Diagnosis not present

## 2021-02-14 NOTE — Progress Notes (Signed)
Knee MRI looks normal.  Possibly the source of your symptoms is coming from her back.  Lets talk about it further during the clinic visit.  Please return to clinic to discuss this MRI and discuss further evaluation and treatment plan and options.  Additionally this will generate an office note that will allow me to justify future testing to your insurance.

## 2021-02-20 ENCOUNTER — Encounter: Payer: Self-pay | Admitting: Family Medicine

## 2021-02-20 ENCOUNTER — Ambulatory Visit (INDEPENDENT_AMBULATORY_CARE_PROVIDER_SITE_OTHER): Payer: 59

## 2021-02-20 ENCOUNTER — Other Ambulatory Visit: Payer: Self-pay

## 2021-02-20 ENCOUNTER — Ambulatory Visit: Payer: 59 | Admitting: Family Medicine

## 2021-02-20 VITALS — BP 120/78 | HR 112 | Ht 62.0 in | Wt 120.8 lb

## 2021-02-20 DIAGNOSIS — M79662 Pain in left lower leg: Secondary | ICD-10-CM | POA: Diagnosis not present

## 2021-02-20 DIAGNOSIS — M5416 Radiculopathy, lumbar region: Secondary | ICD-10-CM | POA: Diagnosis not present

## 2021-02-20 DIAGNOSIS — G8929 Other chronic pain: Secondary | ICD-10-CM

## 2021-02-20 DIAGNOSIS — M5442 Lumbago with sciatica, left side: Secondary | ICD-10-CM | POA: Diagnosis not present

## 2021-02-20 NOTE — Patient Instructions (Signed)
Thank you for coming in today.   Please get an Xray today before you leave \  You should hear from MRI scheduling within 1 week. If you do not hear please let me know.   

## 2021-02-20 NOTE — Progress Notes (Signed)
I, Christoper Fabian, LAT, ATC, am serving as scribe for Dr. Clementeen Graham.  Autumn Stephenson is a 30 y.o. female who presents to [facility] today for f/u L knee pain/calf swelling and L knee MRI review. Pt was last seen by Dr. Denyse Amass on 01/18/21 and was advised to proceed to MRI to further characterize cause of pain. She has had a course of PT and was previously seen at [facility].  Today, pt reports no change in her L knee pain and L calf swelling.  She has been thinking about my theory that her calf discomfort may be coming from her back and notes that she has had chronic low back pain for a few years now.  She attributes this to ice hockey.  She is not sure if the back bandlike discomfort or getting the.  Dx testing: 02/12/21 L knee MRI  01/18/21 L knee XR  Pertinent review of systems: No fevers or chills  Relevant historical information: History of blighted ovum   Exam:  BP 120/78 (BP Location: Right Arm, Patient Position: Sitting, Cuff Size: Normal)   Pulse (!) 112   Ht 5\' 2"  (1.575 m)   Wt 120 lb 12.8 oz (54.8 kg)   LMP 02/12/2021   SpO2 99%   BMI 22.09 kg/m  General: Well Developed, well nourished, and in no acute distress.   MSK: L-spine nontender midline.  Tender palpation lumbar paraspinal musculature.  Decreased lumbar motion. Extremity strength is intact. Left knee normal-appearing normal motion nontender normal strength.    Lab and Radiology Results X-ray images L-spine obtained today personally and independently interpreted No acute fractures or severe degenerative changes. Await formal radiology review  EXAM: MRI OF THE LEFT KNEE WITHOUT CONTRAST   TECHNIQUE: Multiplanar, multisequence MR imaging of the knee was performed. No intravenous contrast was administered.   COMPARISON:  Knee radiograph 01/18/2021   FINDINGS: MENISCI   Medial: There is intrasubstance degenerative signal at the posterior horn-body junction. No  definitive meniscus tear.   Lateral: Intact.   LIGAMENTS   Cruciates: ACL and PCL are intact.   Collaterals: Medial collateral ligament is intact. Lateral collateral ligament complex is intact.   CARTILAGE   Patellofemoral:  No chondral defect.   Medial:  No chondral defect.   Lateral:  No chondral defect.   JOINT: No significant joint effusion.   POPLITEAL FOSSA: Miniscule Baker's cyst.   EXTENSOR MECHANISM: Intact quadriceps tendon. Intact patellar tendon.   BONES: No aggressive osseous lesion. No fracture or dislocation.   Other: No fluid collection or hematoma. Muscles are normal.   IMPRESSION: Unremarkable left knee MRI. No evidence of meniscus tear or ligamentous injury.     Electronically Signed   By: 03/20/2021 M.D.   On: 02/13/2021 15:45 I, 02/15/2021, personally (independently) visualized and performed the interpretation of the images attached in this note.      Assessment and Plan: 30 y.o. female with left posterior calf discomfort and pain.  Etiology unclear.  Patient is already had a pretty extensive treatment trial and evaluation trial including normal nerve conduction study November of last year as well as now normal imaging of the knee.  Certainly this could be radicular pain in the S1 distribution pattern.  Plan for lumbar MRI to further characterize cause of pain.  If this is normal would consider ABI although this is less likely.  In addition there is no other test that we can do as well.  Patient is already had an extensive trial of physical therapy dedicated calf which did not help.  She has never had physical therapy for her back pain which we want to address   PDMP not reviewed this encounter. Orders Placed This Encounter  Procedures   DG Lumbar Spine 2-3 Views    Standing Status:   Future    Number of Occurrences:   1    Standing Expiration Date:   02/20/2022    Order Specific Question:   Reason for Exam (SYMPTOM  OR DIAGNOSIS REQUIRED)     Answer:   eval left leg pain and low back pain    Order Specific Question:   Is patient pregnant?    Answer:   No    Order Specific Question:   Preferred imaging location?    Answer:   [facility]   MR Lumbar Spine Wo Contrast    Standing Status:   Future    Standing Expiration Date:   02/20/2022    Order Specific Question:   What is the patient's sedation requirement?    Answer:   No Sedation    Order Specific Question:   Does the patient have a pacemaker or implanted devices?    Answer:   No    Order Specific Question:   Preferred imaging location?    Answer:   [facility] (table limit-350lbs)   No orders of the defined types were placed in this encounter.    Discussed warning signs or symptoms. Please see discharge instructions. Patient expresses understanding.   The above documentation has been reviewed and is accurate and complete Clementeen Graham, M.D.

## 2021-02-22 NOTE — Progress Notes (Signed)
Lumbar spine x-ray shows a little bit of scoliosis curvature but otherwise looks okay to radiology.  We will learn more with an x-ray.

## 2021-02-26 ENCOUNTER — Telehealth: Payer: Self-pay

## 2021-02-26 NOTE — Telephone Encounter (Signed)
Received denial letter for patients MRI of lumbar spine.  Patient needs to have six weeks of provider directed treatment and a follow up (visit, mychart message, or telephone call) discussing lack of progress before MRI will be covered.

## 2021-02-27 NOTE — Telephone Encounter (Signed)
Lets schedule a peer to peer

## 2021-02-27 NOTE — Telephone Encounter (Signed)
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 Please fax your appeal request to [phone]. You may continue to schedule a Peer to Peer discussion for this case, but it will be considered consultative only and the original decision cannot be modified per this discussion.    If you want to write me a letter so I can fax it to them so you do not waste your time I will fax that right away.

## 2021-03-06 ENCOUNTER — Encounter: Payer: Self-pay | Admitting: Family Medicine

## 2021-03-06 ENCOUNTER — Telehealth: Payer: Self-pay | Admitting: *Deleted

## 2021-03-06 NOTE — Telephone Encounter (Signed)
Letter written.  Please notify patient.

## 2021-03-06 NOTE — Telephone Encounter (Signed)
Error

## 2021-03-06 NOTE — Telephone Encounter (Signed)
Pt is calling asking about the status of this MRI. It does not appear that a letter has been completed. Does the pt need to come back in for an appt & reorder MRI?

## 2021-03-12 NOTE — Telephone Encounter (Signed)
Autumn Stephenson has upheld the same rationale with the letter that has been written denying the original request for MRI. Patient must complete six weeks of provider directed treatment, symptoms must be the same or worse after treatment to support imaging, and there needs to be contact with provider after completing treatment. Phone, mychart, office visit.

## 2021-03-15 NOTE — Telephone Encounter (Signed)
Pt called looking for status of back MRI, she has not recd a call from [place]. Unable to determine if this is a final denial or more work has to be done. Please notify patient of status.

## 2021-03-16 NOTE — Telephone Encounter (Signed)
Called and left a message. Asked for a call back.

## 2021-03-16 NOTE — Telephone Encounter (Signed)
I spoke with the patient.  She says that when she called the insurance company she was told that I could do a peer to peer.  I told her that we were told that I could not. Lets double check.  Otherwise plan for phone call recheck on October 18 which would be 6 weeks from last visit September 6 to follow-up for home exercise status.

## 2021-03-22 NOTE — Telephone Encounter (Signed)
I called the number on the denial form to set up a peer to peer and they said we had exhausted all the options to appeal this case.  I dont know who she is calling or going through to where they are saying we can schedule a peer to peer

## 2021-03-25 ENCOUNTER — Encounter (HOSPITAL_COMMUNITY): Payer: Self-pay

## 2021-03-25 ENCOUNTER — Ambulatory Visit (HOSPITAL_COMMUNITY)
Admission: EM | Admit: 2021-03-25 | Discharge: 2021-03-25 | Disposition: A | Discharge status: Home / Self Care | Payer: 59 | Attending: Internal Medicine | Admitting: Internal Medicine

## 2021-03-25 ENCOUNTER — Other Ambulatory Visit: Payer: Self-pay

## 2021-03-25 DIAGNOSIS — R0789 Other chest pain: Secondary | ICD-10-CM

## 2021-03-25 NOTE — ED Triage Notes (Signed)
Pt presents with chest pain x 1 week.  Pt states she has trouble breathing, denies cough.   Pt denies exposure to ill people. States the chest pain only started at night. States now it has been occurring most of the time. States the pain is only on the L side.

## 2021-03-25 NOTE — ED Provider Notes (Signed)
[place]    CSN: [id number] Arrival date & time: 03/25/21  1623      History   Chief Complaint Chief Complaint  Patient presents with   Chest Pain    HPI Autumn Stephenson is a 30 y.o. female presenting with left-sided chest wall pain for about 1 week.  Medical history asthma, seizures.  Describes left-sided chest wall pain for 1 week, worse over the last 2 days.  Pain is elicited with deep inspiration and lying down to go to sleep.  Denies left-sided chest pain at rest, dizziness, weakness, shortness of breath, headaches, vision changes.  Asthma is well controlled on no inhalers.  No family history or personal history of cardiac disease or early cardiac death.  HPI  Past Medical History:  Diagnosis Date   Asthma    Seizures ([place])     Patient Active Problem List   Diagnosis Date Noted   Blighted ovum 09/12/2020   BRCA gene mutation negative 06/08/2019   Insomnia 06/08/2019    Past Surgical History:  Procedure Laterality Date   DILATION AND EVACUATION N/A 09/12/2020   Procedure: DILATATION AND EVACUATION;  Surgeon: Waymon Amato, MD;  Location: [facility];  Service: Gynecology;  Laterality: N/A;    OB History   No obstetric history on file.      Home Medications    Prior to Admission medications   Medication Sig Start Date End Date Taking? Authorizing Provider  lisdexamfetamine (VYVANSE) 50 MG capsule Take 50 mg by mouth daily.    [provider]  QUEtiapine (SEROQUEL) 100 MG tablet Take 100 mg by mouth at bedtime.    [provider]    Family History History reviewed. No pertinent family history.  Social History Social History   Tobacco Use   Smoking status: Every Day    Packs/day: 0.50    Years: 10.00    Pack years: 5.00    Types: Cigarettes   Smokeless tobacco: Never  Vaping Use   Vaping Use: Never used  Substance Use Topics   Alcohol use: Yes    Alcohol/week: 6.0 standard drinks    Types: 3 Glasses of wine, 3 Cans of  beer per week   Drug use: Never     Allergies   Patient has no known allergies.   Review of Systems Review of Systems  Cardiovascular:  Negative for chest pain, palpitations and leg swelling.       Chest wall pain    Physical Exam Triage Vital Signs ED Triage Vitals  Enc Vitals Group     BP 03/25/21 1800 (!) 102/46     Pulse Rate 03/25/21 1758 70     Resp 03/25/21 1758 18     Temp 03/25/21 1758 98.2 F (36.8 C)     Temp Source 03/25/21 1758 Oral     SpO2 03/25/21 1758 100 %     Weight --      Height --      Head Circumference --      Peak Flow --      Pain Score 03/25/21 1757 8     Pain Loc --      Pain Edu? --      Excl. in [place]? --    No data found.  Updated Vital Signs BP (!) 102/46 (BP Location: Right Arm)   Pulse 70   Temp 98.2 F (36.8 C) (Oral)   Resp 18   LMP 03/14/2021 (Approximate)   SpO2 100%  Visual Acuity Right Eye Distance:   Left Eye Distance:   Bilateral Distance:    Right Eye Near:   Left Eye Near:    Bilateral Near:     Physical Exam Vitals reviewed.  Constitutional:      Appearance: Normal appearance. She is not diaphoretic.  HENT:     Head: Normocephalic and atraumatic.     Mouth/Throat:     Mouth: Mucous membranes are moist.  Eyes:     Extraocular Movements: Extraocular movements intact.     Pupils: Pupils are equal, round, and reactive to light.  Cardiovascular:     Rate and Rhythm: Normal rate and regular rhythm.     Pulses:          Radial pulses are 2+ on the right side and 2+ on the left side.     Heart sounds: Normal heart sounds.  Pulmonary:     Effort: Pulmonary effort is normal.     Breath sounds: Normal breath sounds.  Chest:     Chest wall: Tenderness present.     Comments: Reproducible pain over L breast  Abdominal:     Palpations: Abdomen is soft.     Tenderness: There is no abdominal tenderness. There is no guarding or rebound.  Musculoskeletal:     Right lower leg: No edema.     Left lower leg: No  edema.  Skin:    General: Skin is warm.     Capillary Refill: Capillary refill takes less than 2 seconds.  Neurological:     General: No focal deficit present.     Mental Status: She is alert and oriented to person, place, and time.  Psychiatric:        Mood and Affect: Mood normal.        Behavior: Behavior normal.        Thought Content: Thought content normal.        Judgment: Judgment normal.     UC Treatments / Results  Labs (all labs ordered are listed, but only abnormal results are displayed) Labs Reviewed - No data to display  EKG   Radiology No results found.  Procedures Procedures (including critical care time)  Medications Ordered in UC Medications - No data to display  Initial Impression / Assessment and Plan / UC Course  I have reviewed the triage vital signs and the nursing notes.  Pertinent labs & imaging results that were available during my care of the patient were reviewed by me and considered in my medical decision making (see chart for details).     This patient is a very pleasant 30 y.o. year old female presenting with chest wall pain.   Pain is reproducible. EKG NSR, no prior EKG for comparison.   History asthma- currently well controlled on no medications.   Tylenol/ibuprofen, rest, heating pad.   Strict ED return precautions discussed. Patient verbalizes understanding and agreement.   Final Clinical Impressions(s) / UC Diagnoses   Final diagnoses:  Chest wall pain     Discharge Instructions      -Tylenol/ibuprofen, heating pad, rest -If new or worsening chest pain, especially if this is associated with shortness of breath, weakness, left arm pain, dizziness-head straight to the emergency department or call 911.     ED Prescriptions   None    PDMP not reviewed this encounter.   Hazel Sams, PA-C 03/27/21 [phone]

## 2021-03-25 NOTE — Discharge Instructions (Addendum)
-  Tylenol/ibuprofen, heating pad, rest -If new or worsening chest pain, especially if this is associated with shortness of breath, weakness, left arm pain, dizziness-head straight to the emergency department or call 911.

## 2021-04-04 NOTE — Telephone Encounter (Signed)
Has been 6 weeks since her MRI was denied. She asked what the next steps would be.

## 2021-04-06 NOTE — Telephone Encounter (Signed)
Yes I think a follow up visit so I can repeat an exam and document that she has completed the home exercises is a good idea. This will give [place] the best chance to get the MRI approved.

## 2021-04-06 NOTE — Telephone Encounter (Signed)
Appointment scheduled.

## 2021-04-10 NOTE — Progress Notes (Signed)
I, Autumn Stephenson, LAT, ATC, am serving as scribe for Dr. Clementeen Stephenson.  Autumn Stephenson is a 30 y.o. female who presents to [facility] today for f/u of L knee pain/calf swelling and low back pain.  She was last seen by Dr. Denyse Stephenson on 02/20/21 and due to prior thorough work-up for her L knee and LE including L knee MRI, course of PT and normal nerve conduction study November of last year as well as now normal imaging of the knee, she was referred for a lumbar MRI to determine possible lumbar spine cause of her L knee/leg pain.  She has had a prior course of PT at [facility] for approximately 10 weeks w/ no change in her leg pain.  Her lumbar spine MRI was denied by her insurance.  Today, pt reports that her L lower leg pain remains unchanged and con't to have L post lower leg/calf pain particularly w/ walking.  She does still have low back pain as well.  She is also having B LE paresthesias especially if she sits w/ her legs crossed for longer than 5 min. She has been doing physician directed home exercise program over the last 6 weeks plus.  She has been doing core strengthening and leg strengthening as well as lumbar range of motion exercises.  She has been doing them at least 20 minutes a day at least 3 days a week.  Diagnostic testing: L-spine XR- 02/20/21; L knee MRI- 02/12/21; L knee XR- 01/18/21  Pertinent review of systems: No fevers or chills  Relevant historical information: Insomnia   Exam:  BP 102/70 (BP Location: Right Arm, Patient Position: Sitting, Cuff Size: Normal)   Pulse (!) 121   Ht 5\' 2"  (1.575 m)   Wt 115 lb 12.8 oz (52.5 kg)   LMP 03/14/2021 (Approximate)   SpO2 97%   BMI 21.18 kg/m  General: Well Developed, well nourished, and in no acute distress.   MSK: L-spine: Nontender midline.   Normal lumbar motion. Negative slump test bilaterally. Lower extremity strength is intact with exception of left foot dorsiflexion which is diminished  4/5. Reflexes are intact. Sensation is intact throughout.  Left calf mildly tender palpation posterior calf.  Pulses intact distally.    Lab and Radiology Results EXAM: LUMBAR SPINE - 2-3 VIEW   COMPARISON:  None.   FINDINGS: Mild dextroscoliosis. Sagittal alignment is normal. The disc spaces are patent. The vertebral body heights are normal   IMPRESSION: Mild dextrocurvature.  Otherwise negative     Electronically Signed   By: 03/16/2021 M.D.   On: 02/21/2021 23:04   I, 04/23/2021, personally (independently) visualized and performed the interpretation of the images attached in this note.      Assessment and Plan: 30 y.o. female with left leg pain predominantly at the posterior calf.  Patient has had extensive evaluation and treatment prior to my care last year including trials of physical therapy and x-rays and even a nerve conduction study that was nondiagnostic.  At this point most likely explanation is lumbar radiculopathy.  She has completed a physician directed home exercise program over the last 6 weeks and has had an x-ray.  Plan to proceed to MRI of her lumbar spine to further characterize potential for left lumbar radiculopathy.  Additionally she has weakness of left foot dorsiflexion which would be typical for left L4 or L5 lumbar radiculopathy. Plan for MRI and check back after MRI.   PDMP  not reviewed this encounter. Orders Placed This Encounter  Procedures   MR Lumbar Spine Wo Contrast    Lumbar spine MRI to assess for likely lumbar radiculopathy into R post lower leg    Standing Status:   Future    Standing Expiration Date:   04/11/2022    Order Specific Question:   What is the patient's sedation requirement?    Answer:   No Sedation    Order Specific Question:   Does the patient have a pacemaker or implanted devices?    Answer:   No    Order Specific Question:   Preferred imaging location?    Answer:   [facility] (table limit-350lbs)    No orders of the defined types were placed in this encounter.    Discussed warning signs or symptoms. Please see discharge instructions. Patient expresses understanding.   The above documentation has been reviewed and is accurate and complete Autumn Stephenson, M.D.

## 2021-04-11 ENCOUNTER — Encounter: Payer: Self-pay | Admitting: Family Medicine

## 2021-04-11 ENCOUNTER — Other Ambulatory Visit: Payer: Self-pay

## 2021-04-11 ENCOUNTER — Ambulatory Visit: Payer: 59 | Admitting: Family Medicine

## 2021-04-11 VITALS — BP 102/70 | HR 121 | Ht 62.0 in | Wt 115.8 lb

## 2021-04-11 DIAGNOSIS — M5416 Radiculopathy, lumbar region: Secondary | ICD-10-CM

## 2021-04-11 NOTE — Patient Instructions (Addendum)
Good to see you today.  MRI has been ordered to [facility].  Please let us know if you are not hearing from them in the next week regarding scheduling.  Follow-up: after MRI

## 2021-04-18 NOTE — Addendum Note (Signed)
Addended by: Debbe Odea R on: 04/18/2021 12:03 PM   Modules accepted: Orders

## 2021-05-06 ENCOUNTER — Other Ambulatory Visit: Payer: Self-pay

## 2021-05-06 ENCOUNTER — Ambulatory Visit
Admission: RE | Admit: 2021-05-06 | Discharge: 2021-05-06 | Disposition: A | Discharge status: Home / Self Care | Payer: 59 | Source: Ambulatory Visit | Attending: Family Medicine | Admitting: Family Medicine

## 2021-05-06 DIAGNOSIS — M5416 Radiculopathy, lumbar region: Secondary | ICD-10-CM

## 2021-05-08 NOTE — Progress Notes (Signed)
MRI of the lumbar spine does not explain your pain.  There is not enough bulging disc or nerve compression to cause leg pain on this MRI.  There is a tiny bulging disc in the lumbar spine but it does not compress any nerves therefore should not be causing leg pain.  Next step would be the blood flow test in your leg.  Would you like me to order this?

## 2021-05-09 ENCOUNTER — Telehealth: Payer: Self-pay | Admitting: Physical Therapy

## 2021-05-09 DIAGNOSIS — M79662 Pain in left lower leg: Secondary | ICD-10-CM

## 2021-05-09 NOTE — Telephone Encounter (Signed)
Called pt to relay her L-spine MRI results.  Pt states that she would like to proceed w/ the blood flow study mentioned in her result note.  Please advise.

## 2021-05-09 NOTE — Telephone Encounter (Signed)
Patient scheduled for ABI at [facility] 05/21/2021 at 2:00pm. Patient notified.

## 2021-05-09 NOTE — Telephone Encounter (Signed)
ABI ordered to the [facility] location.  We need to contact the location to make them aware.

## 2021-05-09 NOTE — Addendum Note (Signed)
Addended by: Rodolph Bong on: 05/09/2021 11:59 AM   Modules accepted: Orders

## 2021-05-16 ENCOUNTER — Telehealth: Payer: 59 | Admitting: Physician Assistant

## 2021-05-16 DIAGNOSIS — J019 Acute sinusitis, unspecified: Secondary | ICD-10-CM | POA: Diagnosis not present

## 2021-05-16 DIAGNOSIS — B9689 Other specified bacterial agents as the cause of diseases classified elsewhere: Secondary | ICD-10-CM

## 2021-05-16 MED ORDER — MOMETASONE FUROATE 50 MCG/ACT NA SUSP: 2.0000 | Freq: Every day | NASAL | 12 refills | Status: DC

## 2021-05-16 MED ORDER — AMOXICILLIN-POT CLAVULANATE 875-125 MG PO TABS: 1.0000 | ORAL_TABLET | Freq: Two times a day (BID) | ORAL | 0 refills | Status: DC

## 2021-05-16 NOTE — Patient Instructions (Signed)
Autumn Simmer Shipton, thank you for joining Piedad Climes, PA-C for today's virtual visit.  While this provider is not your primary care provider (PCP), if your PCP is located in our provider database this encounter information will be shared with them immediately following your visit.  Consent: (Patient) Autumn Stephenson provided verbal consent for this virtual visit at the beginning of the encounter.  Current Medications:  Current Outpatient Medications:    lisdexamfetamine (VYVANSE) 50 MG capsule, Take 50 mg by mouth daily., Disp: , Rfl:    QUEtiapine (SEROQUEL) 100 MG tablet, Take 100 mg by mouth at bedtime., Disp: , Rfl:    Medications ordered in this encounter:  No orders of the defined types were placed in this encounter.    *If you need refills on other medications prior to your next appointment, please contact your pharmacy*  Follow-Up: Call back or seek an in-person evaluation if the symptoms worsen or if the condition fails to improve as anticipated.  Other Instructions Please take antibiotic as directed.  Increase fluid intake.  Use Saline nasal spray.  Take a daily multivitamin. Restart the Nasonex.  Place a humidifier in the bedroom.  Please call or return clinic if symptoms are not improving.  Sinusitis Sinusitis is redness, soreness, and swelling (inflammation) of the paranasal sinuses. Paranasal sinuses are air pockets within the bones of your face (beneath the eyes, the middle of the forehead, or above the eyes). In healthy paranasal sinuses, mucus is able to drain out, and air is able to circulate through them by way of your nose. However, when your paranasal sinuses are inflamed, mucus and air can become trapped. This can allow bacteria and other germs to grow and cause infection. Sinusitis can develop quickly and last only a short time (acute) or continue over a long period (chronic). Sinusitis that lasts for more than 12 weeks is considered chronic.  CAUSES   Causes of sinusitis include: Allergies. Structural abnormalities, such as displacement of the cartilage that separates your nostrils (deviated septum), which can decrease the air flow through your nose and sinuses and affect sinus drainage. Functional abnormalities, such as when the small hairs (cilia) that line your sinuses and help remove mucus do not work properly or are not present. SYMPTOMS  Symptoms of acute and chronic sinusitis are the same. The primary symptoms are pain and pressure around the affected sinuses. Other symptoms include: Upper toothache. Earache. Headache. Bad breath. Decreased sense of smell and taste. A cough, which worsens when you are lying flat. Fatigue. Fever. Thick drainage from your nose, which often is green and may contain pus (purulent). Swelling and warmth over the affected sinuses. DIAGNOSIS  Your caregiver will perform a physical exam. During the exam, your caregiver may: Look in your nose for signs of abnormal growths in your nostrils (nasal polyps). Tap over the affected sinus to check for signs of infection. View the inside of your sinuses (endoscopy) with a special imaging device with a light attached (endoscope), which is inserted into your sinuses. If your caregiver suspects that you have chronic sinusitis, one or more of the following tests may be recommended: Allergy tests. Nasal culture A sample of mucus is taken from your nose and sent to a lab and screened for bacteria. Nasal cytology A sample of mucus is taken from your nose and examined by your caregiver to determine if your sinusitis is related to an allergy. TREATMENT  Most cases of acute sinusitis are related to a viral infection  and will resolve on their own within 10 days. Sometimes medicines are prescribed to help relieve symptoms (pain medicine, decongestants, nasal steroid sprays, or saline sprays).  However, for sinusitis related to a bacterial infection, your caregiver will  prescribe antibiotic medicines. These are medicines that will help kill the bacteria causing the infection.  Rarely, sinusitis is caused by a fungal infection. In theses cases, your caregiver will prescribe antifungal medicine. For some cases of chronic sinusitis, surgery is needed. Generally, these are cases in which sinusitis recurs more than 3 times per year, despite other treatments. HOME CARE INSTRUCTIONS  Drink plenty of water. Water helps thin the mucus so your sinuses can drain more easily. Use a humidifier. Inhale steam 3 to 4 times a day (for example, sit in the bathroom with the shower running). Apply a warm, moist washcloth to your face 3 to 4 times a day, or as directed by your caregiver. Use saline nasal sprays to help moisten and clean your sinuses. Take over-the-counter or prescription medicines for pain, discomfort, or fever only as directed by your caregiver. SEEK IMMEDIATE MEDICAL CARE IF: You have increasing pain or severe headaches. You have nausea, vomiting, or drowsiness. You have swelling around your face. You have vision problems. You have a stiff neck. You have difficulty breathing. MAKE SURE YOU:  Understand these instructions. Will watch your condition. Will get help right away if you are not doing well or get worse. Document Released: 06/03/2005 Document Revised: 08/26/2011 Document Reviewed: 06/18/2011 [facility] Patient Information 2014 [place], [place].    If you have been instructed to have an in-person evaluation today at a local Urgent Care facility, please use the link below. It will take you to a list of all of our available [facility], including address, phone number and hours of operation. Please do not delay care.  [facility]  If you or a family member do not have a primary care provider, use the link below to schedule a visit and establish care. When you choose a [facility] primary care physician or advanced practice  provider, you gain a long-term partner in health. Find a Primary Care Provider  Learn more about [facility]'s in-office and virtual care options: [facility] - Get Care Now

## 2021-05-16 NOTE — Progress Notes (Signed)
Virtual Visit Consent   [place], you are scheduled for a virtual visit with a [facility] provider today.     Just as with appointments in the office, your consent must be obtained to participate.  Your consent will be active for this visit and any virtual visit you may have with one of our providers in the next 365 days.     If you have a MyChart account, a copy of this consent can be sent to you electronically.  All virtual visits are billed to your insurance company just like a traditional visit in the office.    As this is a virtual visit, video technology does not allow for your provider to perform a traditional examination.  This may limit your provider's ability to fully assess your condition.  If your provider identifies any concerns that need to be evaluated in person or the need to arrange testing (such as labs, EKG, etc.), we will make arrangements to do so.     Although advances in technology are sophisticated, we cannot ensure that it will always work on either your end or our end.  If the connection with a video visit is poor, the visit may have to be switched to a telephone visit.  With either a video or telephone visit, we are not always able to ensure that we have a secure connection.     I need to obtain your verbal consent now.   Are you willing to proceed with your visit today?    Autumn Stephenson has provided verbal consent on 05/16/2021 for a virtual visit (video or telephone).   Leeanne Rio, [place]   Date: 05/16/2021 6:37 PM   Virtual Visit via Video Note   I, Leeanne Rio, connected with  Autumn Stephenson  ([id number], 1990-10-22) on 05/16/21 at  6:30 PM EST by a video-enabled telemedicine application and verified that I am speaking with the correct person using two identifiers.  Location: Patient: Virtual Visit Location Patient: Home Provider: Virtual Visit Location Provider: Home Office   I discussed the limitations of evaluation and  management by telemedicine and the availability of in person appointments. The patient expressed understanding and agreed to proceed.    History of Present Illness: Autumn Stephenson is a 30 y.o. who identifies as a female who was assigned female at birth, and is being seen today for > 1 week of URI symptoms starting as sore throat, nasal drainage with sinus pressure. Since then has continued to worsen until the past few days when she started having increased chest congestion and now sinus discomfort/pain. Denies fever, chills, aches.  Nasal drainage went from thin to now thick and yellow/green. Denies recent travel. Has taken Dayquil and Mucinex to try to help with her symptoms.   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   BRCA gene mutation negative 06/08/2019   Insomnia 06/08/2019    Allergies: No Known Allergies Medications:  Current Outpatient Medications:    amoxicillin-clavulanate (AUGMENTIN) 875-125 MG tablet, Take 1 tablet by mouth 2 (two) times daily., Disp: 14 tablet, Rfl: 0   mometasone (NASONEX) 50 MCG/ACT nasal spray, Place 2 sprays into the nose daily., Disp: 1 each, Rfl: 12   lisdexamfetamine (VYVANSE) 50 MG capsule, Take 50 mg by mouth daily., Disp: , Rfl:    QUEtiapine (SEROQUEL) 100 MG tablet, Take 100 mg by mouth at bedtime., Disp: , Rfl:  Observations/Objective: Patient is well-developed, well-nourished in no acute distress.  Resting comfortably at home.  Head is normocephalic, atraumatic.  No labored breathing. Speech is clear and coherent with logical content.  Patient is alert and oriented at baseline.   Assessment and Plan: 1. Acute bacterial sinusitis - mometasone (NASONEX) 50 MCG/ACT nasal spray; Place 2 sprays into the nose daily.  Dispense: 1 each; Refill: 12 - amoxicillin-clavulanate (AUGMENTIN) 875-125 MG tablet; Take 1 tablet by mouth 2 (two) times daily.  Dispense: 14 tablet; Refill: 0 Rx  Augmentin.  Increase fluids.  Rest.  Saline nasal spray.  Probiotic.  Mucinex as directed.  Humidifier in bedroom. Nasonex per orders.  Call or return to clinic if symptoms are not improving.   Follow Up Instructions: I discussed the assessment and treatment plan with the patient. The patient was provided an opportunity to ask questions and all were answered. The patient agreed with the plan and demonstrated an understanding of the instructions.  A copy of instructions were sent to the patient via MyChart unless otherwise noted below.   The patient was advised to call back or seek an in-person evaluation if the symptoms worsen or if the condition fails to improve as anticipated.  Time:  I spent 10 minutes with the patient via telehealth technology discussing the above problems/concerns.    Leeanne Rio, PA-C

## 2021-05-21 ENCOUNTER — Other Ambulatory Visit: Payer: Self-pay

## 2021-05-21 ENCOUNTER — Ambulatory Visit (HOSPITAL_COMMUNITY)
Admission: RE | Admit: 2021-05-21 | Discharge: 2021-05-21 | Disposition: A | Discharge status: Home / Self Care | Payer: 59 | Source: Ambulatory Visit | Attending: Cardiology | Admitting: Cardiology

## 2021-05-21 DIAGNOSIS — M79662 Pain in left lower leg: Secondary | ICD-10-CM | POA: Diagnosis present

## 2021-05-23 ENCOUNTER — Telehealth: Payer: Self-pay | Admitting: Family Medicine

## 2021-05-23 DIAGNOSIS — M79662 Pain in left lower leg: Secondary | ICD-10-CM

## 2021-05-23 NOTE — Telephone Encounter (Signed)
Called and left a message with the patient.  Blood flow test is normal.  Three-phase bone scan ordered to evaluate for complex regional pain syndrome.

## 2021-05-23 NOTE — Progress Notes (Signed)
Blood flow test was normal. I called and left a message.  The next and I think probably the last obvious test to do here is something called a three-phase bone scan to evaluate for complex regional pain syndrome. I have ordered this test.

## 2021-05-31 ENCOUNTER — Encounter (HOSPITAL_COMMUNITY): Payer: 59

## 2021-06-04 ENCOUNTER — Encounter (HOSPITAL_COMMUNITY): Payer: 59

## 2021-06-04 ENCOUNTER — Other Ambulatory Visit (HOSPITAL_COMMUNITY): Payer: 59

## 2021-06-07 ENCOUNTER — Ambulatory Visit (INDEPENDENT_AMBULATORY_CARE_PROVIDER_SITE_OTHER): Payer: 59 | Admitting: Family Medicine

## 2021-06-07 ENCOUNTER — Encounter: Payer: Self-pay | Admitting: Family Medicine

## 2021-06-07 VITALS — BP 110/78 | Ht 62.0 in | Wt 115.0 lb

## 2021-06-07 DIAGNOSIS — S86812A Strain of other muscle(s) and tendon(s) at lower leg level, left leg, initial encounter: Secondary | ICD-10-CM

## 2021-06-07 DIAGNOSIS — S86819A Strain of other muscle(s) and tendon(s) at lower leg level, unspecified leg, initial encounter: Secondary | ICD-10-CM | POA: Insufficient documentation

## 2021-06-07 DIAGNOSIS — M357 Hypermobility syndrome: Secondary | ICD-10-CM | POA: Diagnosis not present

## 2021-06-07 NOTE — Patient Instructions (Signed)
Nice to meet you Please check out [email or web address]  Please check out the body braid   Please send me a message in MyChart with any questions or updates.  Please see me back in 1 week for shockwave .   --Dr. 

## 2021-06-07 NOTE — Assessment & Plan Note (Addendum)
Acute on chronic in nature.  Pain more proximal in nature and worse with activity. -Counseled on home exercise therapy and supportive care. -Shockwave therapy.

## 2021-06-07 NOTE — Progress Notes (Signed)
Autumn Stephenson - 29 y.o. female MRN [id number]  Date of birth: Aug 22, 1990  SUBJECTIVE:  Including CC & ROS.  No chief complaint on file.   Autumn Stephenson is a 30 y.o. female that is presenting for left calf pain.  She has had pain over the past 2 years.  Has had an extensive work-up and treatment thus far.  Pain is still ongoing in the posterior aspect.  Seems to be fatigued the more that she is walking.   Review of Systems See HPI   HISTORY: Past Medical, Surgical, Social, and Family History Reviewed & Updated per EMR.   Pertinent Historical Findings include:  Past Medical History:  Diagnosis Date   Asthma    Seizures (HCC)     Past Surgical History:  Procedure Laterality Date   DILATION AND EVACUATION N/A 09/12/2020   Procedure: DILATATION AND EVACUATION;  Surgeon: Hoover Browns, MD;  [facility];  Service: Gynecology;  Laterality: N/A;    History reviewed. No pertinent family history.  Social History   Socioeconomic History   Marital status: Married    Spouse name: Not on file   Number of children: Not on file   Years of education: Not on file   Highest education level: Not on file  Occupational History   Not on file  Tobacco Use   Smoking status: Every Day    Packs/day: 0.50    Years: 10.00    Pack years: 5.00    Types: Cigarettes   Smokeless tobacco: Never  Vaping Use   Vaping Use: Never used  Substance and Sexual Activity   Alcohol use: Yes    Alcohol/week: 6.0 standard drinks    Types: 3 Glasses of wine, 3 Cans of beer per week   Drug use: Never   Sexual activity: Yes  Other Topics Concern   Not on file  Social History Narrative   Not on file   Social Determinants of Health   Financial Resource Strain: Not on file  Food Insecurity: Not on file  Transportation Needs: Not on file  Physical Activity: Not on file  Stress: Not on file  Social Connections: Not on file  Intimate Partner Violence: Not on file     PHYSICAL EXAM:  VS: BP  110/78 (BP Location: Left Arm, Patient Position: Sitting)    Ht 5\' 2"  (1.575 m)    Wt 115 lb (52.2 kg)    BMI 21.03 kg/m  Physical Exam Gen: NAD, alert, cooperative with exam, well-appearing   ECSWT Note Autumn Stephenson 28-Sep-1990  Procedure: ECSWT Indications: left calf pain   Procedure Details Consent: Risks of procedure as well as the alternatives and risks of each were explained to the (patient/caregiver).  Consent for procedure obtained. Time Out: Verified patient identification, verified procedure, site/side was marked, verified correct patient position, special equipment/implants available, medications/allergies/relevent history reviewed, required imaging and test results available.  Performed.  The area was cleaned with iodine and alcohol swabs.    The left calf was targeted for Extracorporeal shockwave therapy.   Preset: muscle injury  Power Level: 90 Frequency: 10 Impulse/cycles: 2400 Head size: large  Session: 1st  Patient did tolerate procedure well.     ASSESSMENT & PLAN:   Strain of calf muscle Acute on chronic in nature.  Pain more proximal in nature and worse with activity. -Counseled on home exercise therapy and supportive care. -Shockwave therapy.  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 May be contributing to her ongoing posterior knee and  calf pain.

## 2021-06-07 NOTE — Assessment & Plan Note (Signed)
Beighton score 8/9. May be contributing to her ongoing posterior knee and calf pain.

## 2021-06-13 ENCOUNTER — Ambulatory Visit: Payer: 59 | Admitting: Family Medicine

## 2021-06-21 ENCOUNTER — Encounter: Payer: Self-pay | Admitting: Family Medicine

## 2021-06-21 ENCOUNTER — Ambulatory Visit (INDEPENDENT_AMBULATORY_CARE_PROVIDER_SITE_OTHER): Payer: 59 | Admitting: Family Medicine

## 2021-06-21 DIAGNOSIS — S86812D Strain of other muscle(s) and tendon(s) at lower leg level, left leg, subsequent encounter: Secondary | ICD-10-CM

## 2021-06-21 NOTE — Progress Notes (Signed)
°  Autumn Stephenson - 31 y.o. female MRN [id number]  Date of birth: Sep 10, 1990  SUBJECTIVE:  Including CC & ROS.  No chief complaint on file.   Autumn Stephenson is a 31 y.o. female that is  here for shockwave therapy.    Review of Systems See HPI   HISTORY: Past Medical, Surgical, Social, and Family History Reviewed & Updated per EMR.   Pertinent Historical Findings include:  Past Medical History:  Diagnosis Date   Asthma    Seizures (HCC)     Past Surgical History:  Procedure Laterality Date   DILATION AND EVACUATION N/A 09/12/2020   Procedure: DILATATION AND EVACUATION;  Surgeon: Hoover Browns, MD;  [facility];  Service: Gynecology;  Laterality: N/A;    History reviewed. No pertinent family history.  Social History   Socioeconomic History   Marital status: Married    Spouse name: Not on file   Number of children: Not on file   Years of education: Not on file   Highest education level: Not on file  Occupational History   Not on file  Tobacco Use   Smoking status: Every Day    Packs/day: 0.50    Years: 10.00    Pack years: 5.00    Types: Cigarettes   Smokeless tobacco: Never  Vaping Use   Vaping Use: Never used  Substance and Sexual Activity   Alcohol use: Yes    Alcohol/week: 6.0 standard drinks    Types: 3 Glasses of wine, 3 Cans of beer per week   Drug use: Never   Sexual activity: Yes  Other Topics Concern   Not on file  Social History Narrative   Not on file   Social Determinants of Health   Financial Resource Strain: Not on file  Food Insecurity: Not on file  Transportation Needs: Not on file  Physical Activity: Not on file  Stress: Not on file  Social Connections: Not on file  Intimate Partner Violence: Not on file     PHYSICAL EXAM:  VS: Ht 5\' 2"  (1.575 m)    Wt 115 lb (52.2 kg)    BMI 21.03 kg/m  Physical Exam Gen: NAD, alert, cooperative with exam, well-appearing   ECSWT Note Autumn Stephenson [birth year]  Procedure:  ECSWT Indications: left leg pain   Procedure Details Consent: Risks of procedure as well as the alternatives and risks of each were explained to the (patient/caregiver).  Consent for procedure obtained. Time Out: Verified patient identification, verified procedure, site/side was marked, verified correct patient position, special equipment/implants available, medications/allergies/relevent history reviewed, required imaging and test results available.  Performed.  The area was cleaned with iodine and alcohol swabs.    The left calf was targeted for Extracorporeal shockwave therapy.   Preset: muscle injury  Power Level: 100 Frequency: 10 Impulse/cycles: 2800 Head size: large  Session: 2nd  Patient did tolerate procedure well.     ASSESSMENT & PLAN:   Strain of calf muscle Completed shockwave therapy

## 2021-06-21 NOTE — Assessment & Plan Note (Signed)
Completed shockwave therapy  

## 2021-06-26 ENCOUNTER — Inpatient Hospital Stay (HOSPITAL_COMMUNITY): Payer: 59

## 2021-06-26 ENCOUNTER — Other Ambulatory Visit: Payer: Self-pay

## 2021-06-26 ENCOUNTER — Inpatient Hospital Stay (HOSPITAL_COMMUNITY)
Admission: AD | Admit: 2021-06-26 | Discharge: 2021-06-26 | Disposition: A | Discharge status: Home / Self Care | Payer: 59 | Attending: Obstetrics and Gynecology | Admitting: Obstetrics and Gynecology

## 2021-06-26 ENCOUNTER — Encounter (HOSPITAL_COMMUNITY): Payer: Self-pay | Admitting: Obstetrics and Gynecology

## 2021-06-26 DIAGNOSIS — Z3491 Encounter for supervision of normal pregnancy, unspecified, first trimester: Secondary | ICD-10-CM

## 2021-06-26 DIAGNOSIS — Z3A01 Less than 8 weeks gestation of pregnancy: Secondary | ICD-10-CM | POA: Insufficient documentation

## 2021-06-26 DIAGNOSIS — O209 Hemorrhage in early pregnancy, unspecified: Secondary | ICD-10-CM | POA: Diagnosis present

## 2021-06-26 LAB — CBC
HCT: 37.6 % (ref 36.0–46.0)
Hemoglobin: 12.6 g/dL (ref 12.0–15.0)
MCH: 28.8 pg (ref 26.0–34.0)
MCHC: 33.5 g/dL (ref 30.0–36.0)
MCV: 86 fL (ref 80.0–100.0)
Platelets: 320 10*3/uL (ref 150–400)
RBC: 4.37 MIL/uL (ref 3.87–5.11)
RDW: 12.3 % (ref 11.5–15.5)
WBC: 7.8 10*3/uL (ref 4.0–10.5)
nRBC: 0 % (ref 0.0–0.2)

## 2021-06-26 LAB — HCG, QUANTITATIVE, PREGNANCY: hCG, Beta Chain, Quant, S: 12844 m[IU]/mL — ABNORMAL HIGH (ref <5)

## 2021-06-26 LAB — WET PREP, GENITAL
Sperm: NONE SEEN
Trich, Wet Prep: NONE SEEN
WBC, Wet Prep HPF POC: <10 (ref <10)
Yeast Wet Prep HPF POC: NONE SEEN

## 2021-06-26 LAB — POCT PREGNANCY, URINE: Preg Test, Ur: POSITIVE — AB

## 2021-06-26 NOTE — MAU Provider Note (Signed)
History     CSN: [id number]  Arrival date and time: 06/26/21 1928   Event Date/Time   First Provider Initiated Contact with Patient 06/26/21 2159      Chief Complaint  Patient presents with   Vaginal Bleeding   HPI Autumn Stephenson is a 31 y.o. G2P0010 at [redacted]w[redacted]d who presents with vaginal bleeding. She states she had a positive HPT last week. She reports at 1530 she saw light pink spotting in her underwear. Then at 1800 she saw bright red bleeding on her underwear. She has not seen any since. She denies any pain. She reports a hx of 8 week loss in March of 2022 with her first pregnancy requiring a D&E and she is understandably worried about this pregnancy.    OB History     Gravida  2   Para      Term      Preterm      AB  1   Living  0      SAB  1   IAB      Ectopic      Multiple      Live Births  0           Past Medical History:  Diagnosis Date   Asthma    Seizures (HCC)     Past Surgical History:  Procedure Laterality Date   DILATION AND EVACUATION N/A 09/12/2020   Procedure: DILATATION AND EVACUATION;  Surgeon: Hoover Browns, MD;  [facility];  Service: Gynecology;  Laterality: N/A;    No family history on file.  Social History   Tobacco Use   Smoking status: Every Day    Packs/day: 0.50    Years: 10.00    Pack years: 5.00    Types: Cigarettes   Smokeless tobacco: Never  Vaping Use   Vaping Use: Never used  Substance Use Topics   Alcohol use: Yes    Alcohol/week: 6.0 standard drinks    Types: 3 Glasses of wine, 3 Cans of beer per week   Drug use: Never    Allergies: No Known Allergies  Medications Prior to Admission  Medication Sig Dispense Refill Last Dose   amoxicillin-clavulanate (AUGMENTIN) 875-125 MG tablet Take 1 tablet by mouth 2 (two) times daily. 14 tablet 0    lisdexamfetamine (VYVANSE) 50 MG capsule Take 50 mg by mouth daily.      mometasone (NASONEX) 50 MCG/ACT nasal spray Place 2 sprays into the nose daily. 1 each  12    QUEtiapine (SEROQUEL) 100 MG tablet Take 100 mg by mouth at bedtime.       Review of Systems  Constitutional: Negative.  Negative for fatigue and fever.  HENT: Negative.    Respiratory: Negative.  Negative for shortness of breath.   Cardiovascular: Negative.  Negative for chest pain.  Gastrointestinal: Negative.  Negative for abdominal pain, constipation, diarrhea, nausea and vomiting.  Genitourinary:  Positive for vaginal bleeding. Negative for dysuria and vaginal discharge.  Neurological: Negative.  Negative for dizziness and headaches.  Physical Exam   Blood pressure 120/86, pulse (!) 119, temperature 98.6 F (37 C), temperature source Oral, resp. rate 18, height 5\' 2"  (1.575 m), weight 51.9 kg, last menstrual period 04/29/2021.  Physical Exam Vitals and nursing note reviewed.  Constitutional:      General: She is not in acute distress.    Appearance: She is well-developed.  HENT:     Head: Normocephalic.  Eyes:     Pupils: Pupils  are equal, round, and reactive to light.  Cardiovascular:     Rate and Rhythm: Normal rate and regular rhythm.     Heart sounds: Normal heart sounds.  Pulmonary:     Effort: Pulmonary effort is normal. No respiratory distress.     Breath sounds: Normal breath sounds.  Abdominal:     General: Bowel sounds are normal. There is no distension.     Palpations: Abdomen is soft.     Tenderness: There is no abdominal tenderness.  Skin:    General: Skin is warm and dry.  Neurological:     Mental Status: She is alert and oriented to person, place, and time.  Psychiatric:        Mood and Affect: Mood normal.        Behavior: Behavior normal.        Thought Content: Thought content normal.        Judgment: Judgment normal.    MAU Course  Procedures Results for orders placed or performed during the hospital encounter of 06/26/21 (from the past 24 hour(s))  Pregnancy, urine POC     Status: Abnormal   Collection Time: 06/26/21  8:29 PM  Result  Value Ref Range   Preg Test, Ur POSITIVE (A) NEGATIVE  CBC     Status: None   Collection Time: 06/26/21  9:26 PM  Result Value Ref Range   WBC 7.8 4.0 - 10.5 K/uL   RBC 4.37 3.87 - 5.11 MIL/uL   Hemoglobin 12.6 12.0 - 15.0 g/dL   HCT 16.137.6 09.636.0 - 04.546.0 %   MCV 86.0 80.0 - 100.0 fL   MCH 28.8 26.0 - 34.0 pg   MCHC 33.5 30.0 - 36.0 g/dL   RDW 40.912.3 81.111.5 - 91.415.5 %   Platelets 320 150 - 400 K/uL   nRBC 0.0 0.0 - 0.2 %  hCG, quantitative, pregnancy     Status: Abnormal   Collection Time: 06/26/21  9:26 PM  Result Value Ref Range   hCG, Beta Chain, Quant, S 12,844 (H) <5 mIU/mL  Wet prep, genital     Status: Abnormal   Collection Time: 06/26/21 10:06 PM  Result Value Ref Range   Yeast Wet Prep HPF POC NONE SEEN NONE SEEN   Trich, Wet Prep NONE SEEN NONE SEEN   Clue Cells Wet Prep HPF POC PRESENT (A) NONE SEEN   WBC, Wet Prep HPF POC <10 <10   Sperm NONE SEEN     US OB LESS THAN 14 WEEKS WITH OB TRANSVAGINAL  Result Date: 06/26/2021 CLINICAL DATA:  Vaginal bleeding. Gestational age by last menstrual period of 8 weeks and 2 days. Estimated due date by last menstrual period 02/03/2022. Last menstrual 04/29/2021 EXAM: OBSTETRIC <14 WK US AND TRANSVAGINAL OB US TECHNIQUE: Both transabdominal and transvaginal ultrasound examinations were performed for complete evaluation of the gestation as well as the maternal uterus, adnexal regions, and pelvic cul-de-sac. Transvaginal technique was performed to assess early pregnancy. COMPARISON:  None. FINDINGS: Intrauterine gestational sac: Single Yolk sac:  Visualized. Embryo:  Not Visualized. Cardiac Activity: Not Visualized. MSD: 7  mm   5 w   2  d Subchorionic hemorrhage:  None visualized. Maternal uterus/adnexae: Bilateral ovaries are unremarkable. A corpus luteum cyst is noted within the left ovary. The uterus is otherwise unremarkable. Other: Trace free fluid within the pelvis. IMPRESSION: 1. Probable early intrauterine gestational sac and yolk sac with no  fetal pole or cardiac activity yet visualized. Recommend follow-up quantitative B-HCG levels and  follow-up US in 14 days to assess viability. This recommendation follows SRU consensus guidelines: Diagnostic Criteria for Nonviable Pregnancy Early in the First Trimester. Malva Limes Med 2013; 332:9518-84. 2. Gestational age of [redacted] weeks and 2 days by ultrasound is discordant with gestational age by last menstrual period of 8 weeks and 2 days. Electronically Signed   By: Tish Frederickson M.D.   On: 06/26/2021 21:33     MDM UA, UPT CBC, HCG ABO/Rh- A Pos Wet prep and gc/chlamydia US OB Comp Less 14 weeks with Transvaginal   Reviewed results with patient and partner. Discussed normalcy of gestational sac and yolk sac at this gestational age and expectation of progression in a normal pregnancy. Discussed that patient needs repeat ultrasound in 2 weeks to ensure viability. Patient and partner verbalized understanding and desire to have repeat ultrasound with [facility]. Order placed  Clue cells on wet prep but patient denies abnormal discharge or odor.   Assessment and Plan   1. Normal intrauterine pregnancy on prenatal ultrasound in first trimester   2. Vaginal bleeding affecting early pregnancy   3. [redacted] weeks gestation of pregnancy    -Discharge home in stable condition -First trimester precautions discussed -Patient advised to follow-up with [facility] In 2 weeks for repeat ultrasound, order placed -Patient may return to MAU as needed or if her condition were to change or worsen   Rolm Bookbinder CNM 06/26/2021, 9:59 PM

## 2021-06-26 NOTE — MAU Note (Addendum)
PT SAYS POSITIVE UPT AT HOME LAST WEEK HAS AN APPOINTMENT ON 1-18 WITH [facility]  HAD VB 330PM- HAD LIGHT PINK SPOTTING ON UNDERWEAR  THEN AT 6 PM- HEAVIER - RED VB ON UNDERWEAR  IN TRIAGE - SMALL PINK SMALL D/C NO CRAMPING  LAST SEX- Sunday - NO BC

## 2021-06-26 NOTE — Discharge Instructions (Signed)

## 2021-06-27 LAB — GC/CHLAMYDIA PROBE AMP (~~LOC~~) NOT AT ARMC
Chlamydia: NEGATIVE
Comment: NEGATIVE
Comment: NORMAL
Neisseria Gonorrhea: NEGATIVE

## 2021-06-28 ENCOUNTER — Encounter: Payer: Self-pay | Admitting: Family Medicine

## 2021-06-28 ENCOUNTER — Ambulatory Visit (INDEPENDENT_AMBULATORY_CARE_PROVIDER_SITE_OTHER): Payer: 59 | Admitting: Family Medicine

## 2021-06-28 DIAGNOSIS — S86812D Strain of other muscle(s) and tendon(s) at lower leg level, left leg, subsequent encounter: Secondary | ICD-10-CM

## 2021-06-28 NOTE — Progress Notes (Signed)
°  Ronnie Mallette Tourangeau - 31 y.o. female MRN [id number]  Date of birth: 12/22/1990  SUBJECTIVE:  Including CC & ROS.  No chief complaint on file.   Akela Pocius Jeanty is a 31 y.o. female that is here for shockwave therapy.    Review of Systems See HPI   HISTORY: Past Medical, Surgical, Social, and Family History Reviewed & Updated per EMR.   Pertinent Historical Findings include:  Past Medical History:  Diagnosis Date   Asthma    Seizures (HCC)     Past Surgical History:  Procedure Laterality Date   DILATION AND EVACUATION N/A 09/12/2020   Procedure: DILATATION AND EVACUATION;  Surgeon: Hoover Browns, MD;  Location: [facility];  Service: Gynecology;  Laterality: N/A;     PHYSICAL EXAM:  VS: Ht 5\' 2"  (1.575 m)    Wt 114 lb (51.7 kg)    LMP 04/29/2021    BMI 20.85 kg/m  Physical Exam Gen: NAD, alert, cooperative with exam, well-appearing MSK:  Neurovascularly intact    ECSWT Note Rylei Masella Smouse 1991-06-11  Procedure: ECSWT Indications: left calf pain   Procedure Details Consent: Risks of procedure as well as the alternatives and risks of each were explained to the (patient/caregiver).  Consent for procedure obtained. Time Out: Verified patient identification, verified procedure, site/side was marked, verified correct patient position, special equipment/implants available, medications/allergies/relevent history reviewed, required imaging and test results available.  Performed.  The area was cleaned with iodine and alcohol swabs.    The left calf  was targeted for Extracorporeal shockwave therapy.   Preset: muscle injury  Power Level: 110 Frequency: 10 Impulse/cycles: 3200 Head size: large  Session: 3rd  Patient did tolerate procedure well.    ASSESSMENT & PLAN:   Strain of calf muscle Completed shockwave therapy

## 2021-06-28 NOTE — Assessment & Plan Note (Signed)
Completed shockwave therapy  

## 2021-07-04 LAB — OB RESULTS CONSOLE HGB/HCT, BLOOD
HCT: 37 (ref 29–41)
Hemoglobin: 12.6

## 2021-07-04 LAB — OB RESULTS CONSOLE GC/CHLAMYDIA
Chlamydia: NEGATIVE
Gonorrhea: NEGATIVE

## 2021-07-04 LAB — OB RESULTS CONSOLE RPR: RPR: NONREACTIVE

## 2021-07-04 LAB — OB RESULTS CONSOLE RUBELLA ANTIBODY, IGM: Rubella: IMMUNE

## 2021-07-04 LAB — OB RESULTS CONSOLE HIV ANTIBODY (ROUTINE TESTING): HIV: NONREACTIVE

## 2021-07-04 LAB — OB RESULTS CONSOLE PLATELET COUNT: Platelets: 228

## 2021-07-04 LAB — HEPATITIS C ANTIBODY: HCV Ab: POSITIVE

## 2021-07-04 LAB — OB RESULTS CONSOLE ABO/RH: RH Type: POSITIVE

## 2021-07-04 LAB — HEMOGLOBIN A1C: A1c: 5.2

## 2021-07-04 LAB — OB RESULTS CONSOLE HEPATITIS B SURFACE ANTIGEN: Hepatitis B Surface Ag: NEGATIVE

## 2021-07-04 LAB — SICKLE CELL SCREEN: Sickle Cell Screen: NORMAL

## 2021-07-04 LAB — OB RESULTS CONSOLE ANTIBODY SCREEN: Antibody Screen: NEGATIVE

## 2021-07-05 ENCOUNTER — Ambulatory Visit (INDEPENDENT_AMBULATORY_CARE_PROVIDER_SITE_OTHER): Payer: 59 | Admitting: Family Medicine

## 2021-07-05 ENCOUNTER — Encounter: Payer: Self-pay | Admitting: Family Medicine

## 2021-07-05 DIAGNOSIS — S86812D Strain of other muscle(s) and tendon(s) at lower leg level, left leg, subsequent encounter: Secondary | ICD-10-CM

## 2021-07-05 NOTE — Assessment & Plan Note (Signed)
Completed shockwave therapy  

## 2021-07-05 NOTE — Progress Notes (Signed)
°  Autumn Stephenson - 31 y.o. female MRN [id number]  Date of birth: 1990/09/05  SUBJECTIVE:  Including CC & ROS.  No chief complaint on file.   Autumn Stephenson is a 31 y.o. female that is here for shockwave therapy.    Review of Systems See HPI   HISTORY: Past Medical, Surgical, Social, and Family History Reviewed & Updated per EMR.   Pertinent Historical Findings include:  Past Medical History:  Diagnosis Date   Asthma    Seizures ([place])     Past Surgical History:  Procedure Laterality Date   DILATION AND EVACUATION N/A 09/12/2020   Procedure: DILATATION AND EVACUATION;  Surgeon: Waymon Amato, MD;  Location: [place];  Service: Gynecology;  Laterality: N/A;     PHYSICAL EXAM:  VS: Ht 5\' 2"  (1.575 m)    Wt 114 lb (51.7 kg)    LMP 04/29/2021    BMI 20.85 kg/m  Physical Exam Gen: NAD, alert, cooperative with exam, well-appearing MSK:  Neurovascularly intact    ECSWT Note Autumn Stephenson 04/30/1991  Procedure: ECSWT Indications: left calf pain   Procedure Details Consent: Risks of procedure as well as the alternatives and risks of each were explained to the (patient/caregiver).  Consent for procedure obtained. Time Out: Verified patient identification, verified procedure, site/side was marked, verified correct patient position, special equipment/implants available, medications/allergies/relevent history reviewed, required imaging and test results available.  Performed.  The area was cleaned with iodine and alcohol swabs.    The left calf was targeted for Extracorporeal shockwave therapy.   Preset: muscle injury  Power Level: 120 Frequency: 10 Impulse/cycles: 3400 Head size: large  Session: 4th  Patient did tolerate procedure well.    ASSESSMENT & PLAN:   No problem-specific Assessment & Plan notes found for this encounter.

## 2021-09-28 ENCOUNTER — Ambulatory Visit (INDEPENDENT_AMBULATORY_CARE_PROVIDER_SITE_OTHER): Payer: 59 | Admitting: Family

## 2021-09-28 VITALS — BP 105/66 | HR 78 | Wt 131.3 lb

## 2021-09-28 DIAGNOSIS — Z3492 Encounter for supervision of normal pregnancy, unspecified, second trimester: Secondary | ICD-10-CM

## 2021-09-28 DIAGNOSIS — Z3490 Encounter for supervision of normal pregnancy, unspecified, unspecified trimester: Secondary | ICD-10-CM | POA: Insufficient documentation

## 2021-09-28 DIAGNOSIS — F909 Attention-deficit hyperactivity disorder, unspecified type: Secondary | ICD-10-CM | POA: Insufficient documentation

## 2021-09-28 DIAGNOSIS — O0990 Supervision of high risk pregnancy, unspecified, unspecified trimester: Secondary | ICD-10-CM | POA: Insufficient documentation

## 2021-09-28 DIAGNOSIS — R768 Other specified abnormal immunological findings in serum: Secondary | ICD-10-CM

## 2021-09-28 DIAGNOSIS — R7689 Other specified abnormal immunological findings in serum: Secondary | ICD-10-CM | POA: Insufficient documentation

## 2021-09-28 DIAGNOSIS — Z3A18 18 weeks gestation of pregnancy: Secondary | ICD-10-CM | POA: Insufficient documentation

## 2021-09-28 NOTE — Progress Notes (Signed)
? ?  PRENATAL VISIT NOTE ? ?Subjective:  ?Autumn Stephenson is a 31 y.o. G2P0010 at [redacted]w[redacted]d being seen today initiating prenatal care at our office.  Initiated prenatal care at [facility], transferred due to providers leaving the practice.  She is currently monitored for the following issues for this low-risk pregnancy with a history of a Blighted ovum; BRCA gene mutation negative; Insomnia; Hypermobility syndrome; Strain of calf muscle; Supervision of low-risk pregnancy; and ADHD (attention deficit hyperactivity disorder) on their problem list.  Taking meds for ADHD.  Psychologist informed patient plans include to continue during pregnancy.   ? ?Husband voiced concerns of nutritional intake.  Reviewed 24 hour recall, intake appropriate. ? ?Patient reports no complaints.  Contractions: Not present. Vag. Bleeding: None.  Movement: Absent. Denies leaking of fluid.  ? ?The following portions of the patient's history were reviewed and updated as appropriate: allergies, current medications, past family history, past medical history, past social history, past surgical history and problem list.  ? ?Objective:  ? ?Vitals:  ? 09/28/21 0930  ?BP: 105/66  ?Pulse: 78  ?Weight: 131 lb 4.8 oz (59.6 kg)  ? ? ?Fetal Status: Fetal Heart Rate (bpm): 156   Movement: Absent    ? ?General:  Alert, oriented and cooperative. Patient is in no acute distress.  ?Skin: Skin is warm and dry. No rash noted.   ?Cardiovascular: Normal heart rate noted  ?Respiratory: Normal respiratory effort, no problems with respiration noted  ?Abdomen: Soft, gravid, appropriate for gestational age.  Pain/Pressure: Absent     ?Pelvic: Cervical exam deferred        ?Extremities: Normal range of motion.  Edema: None  ?Mental Status: Normal mood and affect. Normal behavior. Normal judgment and thought content.  ? ?Assessment and Plan:  ?Pregnancy: G2P0010 at [redacted]w[redacted]d ?1. Encounter for supervision of low-risk pregnancy in second trimester ?- [place] MFM OB DETAIL +14 WK; Future ?-  Reviewed genetic screening results (low risk, and neg) ?- Discussed AFP, defer until know price; taking prenatal vitamins with Folic Acid ? ?General obstetric precautions including but not limited to vaginal bleeding and pelvic pain reviewed in detail with the patient.  ?Please refer to After Visit Summary for other counseling recommendations.  ? ?Follow-up in 4 weeks ? ?Future Appointments  ?Date Time Provider [place]  ?10/18/2021  8:45 AM [facility] NURSE [facility]  ?10/18/2021  9:00 AM [facility] US1 [facility]  ?10/25/2021 10:55 AM Donnamae Jude, MD [facility]  ? ? ?Venia Carbon Karmen Stabs, CNM ?

## 2021-10-01 DIAGNOSIS — R768 Other specified abnormal immunological findings in serum: Secondary | ICD-10-CM

## 2021-10-02 DIAGNOSIS — Z3492 Encounter for supervision of normal pregnancy, unspecified, second trimester: Secondary | ICD-10-CM

## 2021-10-02 DIAGNOSIS — R768 Other specified abnormal immunological findings in serum: Secondary | ICD-10-CM

## 2021-10-18 ENCOUNTER — Other Ambulatory Visit: Payer: Self-pay | Admitting: *Deleted

## 2021-10-18 ENCOUNTER — Ambulatory Visit: Payer: 59 | Admitting: *Deleted

## 2021-10-18 ENCOUNTER — Ambulatory Visit: Payer: 59 | Attending: Family

## 2021-10-18 ENCOUNTER — Encounter: Payer: Self-pay | Admitting: *Deleted

## 2021-10-18 VITALS — BP 116/65 | HR 69

## 2021-10-18 DIAGNOSIS — O99332 Smoking (tobacco) complicating pregnancy, second trimester: Secondary | ICD-10-CM | POA: Insufficient documentation

## 2021-10-18 DIAGNOSIS — J45909 Unspecified asthma, uncomplicated: Secondary | ICD-10-CM | POA: Insufficient documentation

## 2021-10-18 DIAGNOSIS — Z362 Encounter for other antenatal screening follow-up: Secondary | ICD-10-CM

## 2021-10-18 DIAGNOSIS — O99342 Other mental disorders complicating pregnancy, second trimester: Secondary | ICD-10-CM | POA: Diagnosis not present

## 2021-10-18 DIAGNOSIS — Z3492 Encounter for supervision of normal pregnancy, unspecified, second trimester: Secondary | ICD-10-CM | POA: Insufficient documentation

## 2021-10-18 DIAGNOSIS — O358XX0 Maternal care for other (suspected) fetal abnormality and damage, not applicable or unspecified: Secondary | ICD-10-CM | POA: Insufficient documentation

## 2021-10-18 DIAGNOSIS — Z3689 Encounter for other specified antenatal screening: Secondary | ICD-10-CM | POA: Insufficient documentation

## 2021-10-18 DIAGNOSIS — Z3A21 21 weeks gestation of pregnancy: Secondary | ICD-10-CM | POA: Insufficient documentation

## 2021-10-18 DIAGNOSIS — Z363 Encounter for antenatal screening for malformations: Secondary | ICD-10-CM | POA: Diagnosis present

## 2021-10-18 DIAGNOSIS — O4402 Placenta previa specified as without hemorrhage, second trimester: Secondary | ICD-10-CM | POA: Insufficient documentation

## 2021-10-18 DIAGNOSIS — O99512 Diseases of the respiratory system complicating pregnancy, second trimester: Secondary | ICD-10-CM | POA: Diagnosis not present

## 2021-10-23 ENCOUNTER — Encounter: Payer: Self-pay | Admitting: Family

## 2021-10-23 DIAGNOSIS — O4400 Placenta previa specified as without hemorrhage, unspecified trimester: Secondary | ICD-10-CM | POA: Insufficient documentation

## 2021-10-25 ENCOUNTER — Ambulatory Visit (INDEPENDENT_AMBULATORY_CARE_PROVIDER_SITE_OTHER): Payer: 59 | Admitting: Family Medicine

## 2021-10-25 VITALS — BP 112/71 | HR 82 | Wt 128.4 lb

## 2021-10-25 DIAGNOSIS — O0990 Supervision of high risk pregnancy, unspecified, unspecified trimester: Secondary | ICD-10-CM

## 2021-10-25 DIAGNOSIS — O4400 Placenta previa specified as without hemorrhage, unspecified trimester: Secondary | ICD-10-CM

## 2021-10-25 DIAGNOSIS — R768 Other specified abnormal immunological findings in serum: Secondary | ICD-10-CM

## 2021-10-25 DIAGNOSIS — Z1371 Encounter for nonprocreative screening for genetic disease carrier status: Secondary | ICD-10-CM

## 2021-10-25 NOTE — Patient Instructions (Signed)

## 2021-10-25 NOTE — Progress Notes (Signed)
? ?  PRENATAL VISIT NOTE ? ?Subjective:  ?Autumn Stephenson is a 31 y.o. G2P0010 at [redacted]w[redacted]d being seen today for ongoing prenatal care.  She is currently monitored for the following issues for this low-risk pregnancy and has BRCA gene mutation negative; Insomnia; Hypermobility syndrome; Strain of calf muscle; Supervision of high risk pregnancy, antepartum; ADHD (attention deficit hyperactivity disorder); HCV antibody positive; and Placenta previa antepartum on their problem list. ? ?Patient reports  joint aching, previously on meloxicam. H/o 13 prior broken bones .  Contractions: Not present. Vag. Bleeding: None.  Movement: Present. Denies leaking of fluid.  ? ?The following portions of the patient's history were reviewed and updated as appropriate: allergies, current medications, past family history, past medical history, past social history, past surgical history and problem list.  ? ?Objective:  ? ?Vitals:  ? 10/25/21 1103  ?BP: 112/71  ?Pulse: 82  ?Weight: 128 lb 6.4 oz (58.2 kg)  ? ? ?Fetal Status: Fetal Heart Rate (bpm): 158 Fundal Height: 22 cm Movement: Present    ? ?General:  Alert, oriented and cooperative. Patient is in no acute distress.  ?Skin: Skin is warm and dry. No rash noted.   ?Cardiovascular: Normal heart rate noted  ?Respiratory: Normal respiratory effort, no problems with respiration noted  ?Abdomen: Soft, gravid, appropriate for gestational age.  Pain/Pressure: Absent     ?Pelvic: Cervical exam deferred        ?Extremities: Normal range of motion.  Edema: None  ?Mental Status: Normal mood and affect. Normal behavior. Normal judgment and thought content.  ? ?Assessment and Plan:  ?Pregnancy: G2P0010 at [redacted]w[redacted]d ?1. Supervision of high risk pregnancy, antepartum ?Continue routine prenatal care. ? ?2. BRCA gene mutation negative ? ? ?3. Placenta previa antepartum ?Low lying ?Bleeding precautions ? ?4. HCV antibody positive ?Neg HCV RNA--likely false +ve--will re-collect ?- Hepatitis C  Antibody ? ?Preterm labor symptoms and general obstetric precautions including but not limited to vaginal bleeding, contractions, leaking of fluid and fetal movement were reviewed in detail with the patient. ?Please refer to After Visit Summary for other counseling recommendations.  ? ?Return in 4 weeks (on 11/22/2021) for [facility]. ? ?Future Appointments  ?Date Time Provider [place]  ?11/26/2021 10:55 AM Renee Harder, CNM [facility]  ?12/17/2021  9:30 AM [facility] NURSE [facility]  ?12/17/2021  9:45 AM [facility] US5 [facility]  ? ? ?Donnamae Jude, MD ? ?

## 2021-10-26 LAB — HEPATITIS C ANTIBODY: Hep C Virus Ab: REACTIVE — AB

## 2021-10-29 ENCOUNTER — Telehealth: Payer: Self-pay

## 2021-10-29 NOTE — Telephone Encounter (Addendum)
-----   Message from Reva Bores, MD sent at 10/29/2021  7:58 AM EDT ----- ?Can we add on the HCV quantitative reflex please? ? ?Called pt and pt informed me that she will be able to come in tomorrow at 1030.  Pt placed on schedule. ? ?Jasmon Mattice,RN  ?10/29/21 ?

## 2021-10-29 NOTE — Addendum Note (Signed)
Addended by: Faythe Casa on: 10/29/2021 04:34 PM ? ? Modules accepted: Orders ? ?

## 2021-10-30 ENCOUNTER — Other Ambulatory Visit: Payer: 59

## 2021-10-31 LAB — HCV RNA QUANT: Hepatitis C Quantitation: NOT DETECTED IU/mL

## 2021-11-26 ENCOUNTER — Ambulatory Visit (INDEPENDENT_AMBULATORY_CARE_PROVIDER_SITE_OTHER): Payer: 59

## 2021-11-26 VITALS — BP 99/64 | HR 71 | Wt 139.0 lb

## 2021-11-26 DIAGNOSIS — O0990 Supervision of high risk pregnancy, unspecified, unspecified trimester: Secondary | ICD-10-CM

## 2021-11-26 DIAGNOSIS — Z3A26 26 weeks gestation of pregnancy: Secondary | ICD-10-CM

## 2021-11-26 DIAGNOSIS — O4400 Placenta previa specified as without hemorrhage, unspecified trimester: Secondary | ICD-10-CM

## 2021-11-26 NOTE — Progress Notes (Signed)
   PRENATAL VISIT NOTE  Subjective:  Autumn Stephenson is a 31 y.o. G2P0010 at [redacted]w[redacted]d being seen today for ongoing prenatal care.  She is currently monitored for the following issues for this high-risk pregnancy and has BRCA gene mutation negative; Insomnia; Hypermobility syndrome; Strain of calf muscle; Supervision of high risk pregnancy, antepartum; ADHD (attention deficit hyperactivity disorder); HCV antibody positive; and Placenta previa antepartum on their problem list.  Patient reports no complaints.  Contractions: Not present. Vag. Bleeding: None.  Movement: Present. Denies leaking of fluid.   The following portions of the patient's history were reviewed and updated as appropriate: allergies, current medications, past family history, past medical history, past social history, past surgical history and problem list.   Objective:   Vitals:   11/26/21 1106  BP: 99/64  Pulse: 71  Weight: 139 lb (63 kg)    Fetal Status: Fetal Heart Rate (bpm): 152 Fundal Height: 26 cm Movement: Present     General:  Alert, oriented and cooperative. Patient is in no acute distress.  Skin: Skin is warm and dry. No rash noted.   Cardiovascular: Normal heart rate noted  Respiratory: Normal respiratory effort, no problems with respiration noted  Abdomen: Soft, gravid, appropriate for gestational age.  Pain/Pressure: Absent     Pelvic: Cervical exam deferred        Extremities: Normal range of motion.     Mental Status: Normal mood and affect. Normal behavior. Normal judgment and thought content.   Assessment and Plan:  Pregnancy: G2P0010 at [redacted]w[redacted]d 1. Supervision of high risk pregnancy, antepartum - Routine OB. Doing well, no concerns - Anticipatory guidance for upcoming appointments provided. GTT and labs next visit - Reviewed location of [place] and [facility]U  2. [redacted] weeks gestation of pregnancy - Endorses active fetal movement - FH appropriate  3. Placenta previa antepartum - No bleeding, precautions  given - F/u ultrasound on 7/3   Preterm labor symptoms and general obstetric precautions including but not limited to vaginal bleeding, contractions, leaking of fluid and fetal movement were reviewed in detail with the patient. Please refer to After Visit Summary for other counseling recommendations.   Return in about 2 weeks (around 12/10/2021) for LOB.  Future Appointments  Date Time Provider [place]  12/06/2021  9:30 AM [facility] LAB [facility]  12/17/2021  9:30 AM [facility] NURSE [facility]  12/17/2021  9:45 AM [facility] US5 [facility]  12/19/2021  8:15 AM Gabriel Carina, CNM [facility]  01/02/2022  9:15 AM Johnston Ebbs, NP [facility]  01/16/2022  9:15 AM Gabriel Carina, CNM [facility]  01/30/2022  9:35 AM Gabriel Carina, CNM [facility]  02/06/2022  1:35 PM Gabriel Carina, [facility]M [facility]  02/13/2022  1:35 PM Caren Macadam, MD [facility], CNM

## 2021-12-06 ENCOUNTER — Other Ambulatory Visit: Payer: 59

## 2021-12-06 ENCOUNTER — Other Ambulatory Visit: Payer: Self-pay | Admitting: General Practice

## 2021-12-06 ENCOUNTER — Other Ambulatory Visit: Payer: Self-pay

## 2021-12-06 DIAGNOSIS — O0990 Supervision of high risk pregnancy, unspecified, unspecified trimester: Secondary | ICD-10-CM

## 2021-12-07 LAB — CBC
Hematocrit: 35.6 % (ref 34.0–46.6)
Hemoglobin: 12 g/dL (ref 11.1–15.9)
MCH: 30.8 pg (ref 26.6–33.0)
MCHC: 33.7 g/dL (ref 31.5–35.7)
MCV: 91 fL (ref 79–97)
Platelets: 197 10*3/uL (ref 150–450)
RBC: 3.9 x10E6/uL (ref 3.77–5.28)
RDW: 12.2 % (ref 11.7–15.4)
WBC: 7 10*3/uL (ref 3.4–10.8)

## 2021-12-07 LAB — GLUCOSE TOLERANCE, 2 HOURS W/ 1HR
Glucose, 1 hour: 149 mg/dL (ref 70–179)
Glucose, 2 hour: 89 mg/dL (ref 70–152)
Glucose, Fasting: 74 mg/dL (ref 70–91)

## 2021-12-07 LAB — RPR: RPR Ser Ql: NONREACTIVE

## 2021-12-07 LAB — HIV ANTIBODY (ROUTINE TESTING W REFLEX): HIV Screen 4th Generation wRfx: NONREACTIVE

## 2021-12-17 ENCOUNTER — Other Ambulatory Visit: Payer: Self-pay | Admitting: *Deleted

## 2021-12-17 ENCOUNTER — Ambulatory Visit: Payer: 59 | Admitting: *Deleted

## 2021-12-17 ENCOUNTER — Ambulatory Visit: Payer: 59 | Attending: Family

## 2021-12-17 ENCOUNTER — Other Ambulatory Visit: Payer: Self-pay | Admitting: Obstetrics and Gynecology

## 2021-12-17 VITALS — BP 125/75 | HR 87

## 2021-12-17 DIAGNOSIS — J45909 Unspecified asthma, uncomplicated: Secondary | ICD-10-CM

## 2021-12-17 DIAGNOSIS — Z362 Encounter for other antenatal screening follow-up: Secondary | ICD-10-CM | POA: Diagnosis present

## 2021-12-17 DIAGNOSIS — O0990 Supervision of high risk pregnancy, unspecified, unspecified trimester: Secondary | ICD-10-CM | POA: Diagnosis present

## 2021-12-17 DIAGNOSIS — O4400 Placenta previa specified as without hemorrhage, unspecified trimester: Secondary | ICD-10-CM | POA: Insufficient documentation

## 2021-12-17 DIAGNOSIS — O4403 Placenta previa specified as without hemorrhage, third trimester: Secondary | ICD-10-CM | POA: Diagnosis not present

## 2021-12-17 DIAGNOSIS — O99513 Diseases of the respiratory system complicating pregnancy, third trimester: Secondary | ICD-10-CM | POA: Diagnosis not present

## 2021-12-17 DIAGNOSIS — R768 Other specified abnormal immunological findings in serum: Secondary | ICD-10-CM | POA: Diagnosis present

## 2021-12-17 DIAGNOSIS — Z3A29 29 weeks gestation of pregnancy: Secondary | ICD-10-CM

## 2021-12-17 DIAGNOSIS — O35BXX0 Maternal care for other (suspected) fetal abnormality and damage, fetal cardiac anomalies, not applicable or unspecified: Secondary | ICD-10-CM

## 2021-12-17 DIAGNOSIS — O99333 Smoking (tobacco) complicating pregnancy, third trimester: Secondary | ICD-10-CM

## 2021-12-17 DIAGNOSIS — O283 Abnormal ultrasonic finding on antenatal screening of mother: Secondary | ICD-10-CM

## 2021-12-19 ENCOUNTER — Ambulatory Visit (INDEPENDENT_AMBULATORY_CARE_PROVIDER_SITE_OTHER): Payer: 59 | Admitting: Certified Nurse Midwife

## 2021-12-19 ENCOUNTER — Other Ambulatory Visit: Payer: Self-pay

## 2021-12-19 VITALS — BP 123/84 | HR 82 | Wt 148.4 lb

## 2021-12-19 DIAGNOSIS — F902 Attention-deficit hyperactivity disorder, combined type: Secondary | ICD-10-CM

## 2021-12-19 DIAGNOSIS — O0993 Supervision of high risk pregnancy, unspecified, third trimester: Secondary | ICD-10-CM | POA: Diagnosis not present

## 2021-12-19 DIAGNOSIS — O4443 Low lying placenta NOS or without hemorrhage, third trimester: Secondary | ICD-10-CM

## 2021-12-19 DIAGNOSIS — O4440 Low lying placenta NOS or without hemorrhage, unspecified trimester: Secondary | ICD-10-CM

## 2021-12-19 DIAGNOSIS — Z23 Encounter for immunization: Secondary | ICD-10-CM | POA: Diagnosis not present

## 2021-12-19 DIAGNOSIS — O26893 Other specified pregnancy related conditions, third trimester: Secondary | ICD-10-CM

## 2021-12-19 DIAGNOSIS — Z3A30 30 weeks gestation of pregnancy: Secondary | ICD-10-CM | POA: Diagnosis not present

## 2021-12-19 DIAGNOSIS — M545 Low back pain, unspecified: Secondary | ICD-10-CM

## 2021-12-19 MED ORDER — MAGNESIUM OXIDE -MG SUPPLEMENT 200 MG PO TABS: 400.0000 mg | ORAL_TABLET | Freq: Every day | ORAL | 3 refills | Status: DC

## 2021-12-19 NOTE — Patient Instructions (Signed)
Autumn Stephenson w/ [facility] [address] [place], [place] [address] [phone] [email or web address]  

## 2021-12-19 NOTE — Progress Notes (Signed)
   PRENATAL VISIT NOTE  Subjective:  Autumn Stephenson is a 31 y.o. G2P0010 at [redacted]w[redacted]d being seen today for ongoing prenatal care.  She is currently monitored for the following issues for this high-risk pregnancy and has BRCA gene mutation negative; Insomnia; Hypermobility syndrome; Strain of calf muscle; Supervision of high risk pregnancy, antepartum; ADHD (attention deficit hyperactivity disorder); HCV antibody positive; and Placenta previa antepartum on their problem list.  Patient reports backache.  Contractions: Not present. Vag. Bleeding: None.  Movement: Present. Denies leaking of fluid.   The following portions of the patient's history were reviewed and updated as appropriate: allergies, current medications, past family history, past medical history, past social history, past surgical history and problem list.   Objective:   Vitals:   12/19/21 0828  BP: 123/84  Pulse: 82  Weight: 148 lb 6.4 oz (67.3 kg)    Fetal Status: Fetal Heart Rate (bpm): 143 Fundal Height: 30 cm Movement: Present  Presentation: Vertex  General:  Alert, oriented and cooperative. Patient is in no acute distress.  Skin: Skin is warm and dry. No rash noted.   Cardiovascular: Normal heart rate noted  Respiratory: Normal respiratory effort, no problems with respiration noted  Abdomen: Soft, gravid, appropriate for gestational age.  Pain/Pressure: Absent     Pelvic: Cervical exam deferred        Extremities: Normal range of motion.  Edema: None  Mental Status: Normal mood and affect. Normal behavior. Normal judgment and thought content.   Assessment and Plan:  Pregnancy: G2P0010 at [redacted]w[redacted]d 1. Supervision of high risk pregnancy in third trimester - Doing well, feeling regular and vigorous fetal movement  - Reassured that her quantitative HCV was negative meaning she does not have an active infection - Tdap vaccine greater than or equal to 7yo IM  2. [redacted] weeks gestation of pregnancy - Routine OB care   3.  Low-lying placenta - Last measured at 1.8cm from os, copious reassurance given that she is already at the point that our providers are comfortable with her laboring plus the placenta will continue to move away from the os as her uterus grows  4. Low back pain during pregnancy in third trimester - Encouraged twice daily stretching and gave suggestion for chiropractic care - Magnesium Oxide -Mg Supplement (MAG-OXIDE) 200 MG TABS; Take 2 tablets (400 mg total) by mouth at bedtime. If that amount causes loose stools in the am, switch to $RemoveB'200mg'qgipGovB$  daily at bedtime.  Dispense: 60 tablet; Refill: 3  Preterm labor symptoms and general obstetric precautions including but not limited to vaginal bleeding, contractions, leaking of fluid and fetal movement were reviewed in detail with the patient. Please refer to After Visit Summary for other counseling recommendations.   Return in about 2 weeks (around 01/02/2022) for [facility], IN-PERSON.  Future Appointments  Date Time Provider [place]  01/02/2022  9:15 AM Johnston Ebbs, NP [facility]  01/16/2022  9:15 AM Gabriel Carina, CNM [facility]  01/21/2022  9:15 AM [facility] NURSE [facility]  01/21/2022  9:30 AM [facility] US3 [facility]  01/30/2022  9:35 AM Gabriel Carina, CNM [facility]  02/06/2022  1:35 PM Helaine Chess [facility]  02/13/2022  1:35 PM Caren Macadam, MD [facility]   Gabriel Carina, CNM

## 2022-01-02 ENCOUNTER — Telehealth (INDEPENDENT_AMBULATORY_CARE_PROVIDER_SITE_OTHER): Payer: 59 | Admitting: Student

## 2022-01-02 DIAGNOSIS — O4443 Low lying placenta NOS or without hemorrhage, third trimester: Secondary | ICD-10-CM

## 2022-01-02 DIAGNOSIS — R768 Other specified abnormal immunological findings in serum: Secondary | ICD-10-CM

## 2022-01-02 DIAGNOSIS — O0993 Supervision of high risk pregnancy, unspecified, third trimester: Secondary | ICD-10-CM

## 2022-01-02 DIAGNOSIS — M545 Low back pain, unspecified: Secondary | ICD-10-CM

## 2022-01-02 DIAGNOSIS — O4440 Low lying placenta NOS or without hemorrhage, unspecified trimester: Secondary | ICD-10-CM

## 2022-01-02 DIAGNOSIS — K30 Functional dyspepsia: Secondary | ICD-10-CM

## 2022-01-02 DIAGNOSIS — O26893 Other specified pregnancy related conditions, third trimester: Secondary | ICD-10-CM

## 2022-01-02 DIAGNOSIS — Z3A32 32 weeks gestation of pregnancy: Secondary | ICD-10-CM

## 2022-01-02 MED ORDER — PANTOPRAZOLE SODIUM 40 MG PO TBEC: 40.0000 mg | DELAYED_RELEASE_TABLET | Freq: Every day | ORAL | 0 refills | Status: DC

## 2022-01-02 NOTE — Progress Notes (Signed)
Patient reports heartburn but otherwise feeling fine

## 2022-01-02 NOTE — Progress Notes (Signed)
I connected with  Autumn Stephenson on 01/02/22 at  9:15 AM EDT by MyChart Virtual Video Visit  and verified that I am speaking with the correct person using two identifiers.   I discussed the limitations, risks, security and privacy concerns of performing an evaluation and management service by telephone and the availability of in person appointments. I also discussed with the patient that there may be a patient responsible charge related to this service. The patient expressed understanding and agreed to proceed.  Guy Begin, CMA 01/02/2022  8:41 AM

## 2022-01-02 NOTE — Progress Notes (Signed)
OBSTETRICS PRENATAL VIRTUAL VISIT ENCOUNTER NOTE  Provider location: [facility]   Patient location: Home  I connected with Autumn Stephenson on 01/02/22 at  9:15 AM EDT by MyChart Video Encounter and verified that I am speaking with the correct person using two identifiers. I discussed the limitations, risks, security and privacy concerns of performing an evaluation and management service virtually and the availability of in person appointments. I also discussed with the patient that there may be a patient responsible charge related to this service. The patient expressed understanding and agreed to proceed. Subjective:  Autumn Stephenson is a 31 y.o. G2P0010 at 9w1dbeing seen today for ongoing prenatal care.  She is currently monitored for the following issues for this high-risk pregnancy and has BRCA gene mutation negative; Insomnia; Hypermobility syndrome; Strain of calf muscle; Supervision of high risk pregnancy, antepartum; ADHD (attention deficit hyperactivity disorder); and HCV antibody positive on their problem list.  Patient reports heartburn.  Contractions: Not present.  .  Movement: Present. Denies any leaking of fluid.   The following portions of the patient's history were reviewed and updated as appropriate: allergies, current medications, past family history, past medical history, past social history, past surgical history and problem list.   Objective:  There were no vitals filed for this visit.  Fetal Status:     Movement: Present     General:  Alert, oriented and cooperative. Patient is in no acute distress.  Respiratory: Normal respiratory effort, no problems with respiration noted  Mental Status: Normal mood and affect. Normal behavior. Normal judgment and thought content.  Rest of physical exam deferred due to type of encounter  Imaging: UK[place]MFM OB FOLLOW UP  Result Date:  12/17/2021 ----------------------------------------------------------------------  OBSTETRICS REPORT                       (Signed Final 12/17/2021 01:13 pm) ---------------------------------------------------------------------- Patient Info  ID #:       [id number]                         D.O.B.:  [birth year](30 yrs)  Name:       Autumn Stephenson              Visit Date: 12/17/2021 07:42 am ---------------------------------------------------------------------- Performed By  Attending:        VJohnell ComingsMD         Secondary Phy.:   TDonnamae Jude                                                            MD  Performed By:     BGermain Osgood           Address:          [address]  [place], [place]  Referred By:      Severn          [facility]                                [facility] at                                                             [facility]                                                             Women  Ref. Address:     [address]                    [place], [place] ---------------------------------------------------------------------- Orders  #  Description                           Code        Ordered By  1  [place] MFM OB FOLLOW UP                   [id number]    RAVI SHANKAR  2  [place] MFM OB TRANSVAGINAL                [id number]     RAVI [facility] ----------------------------------------------------------------------  #  Order #                     Accession #                Episode #  1  [id number]                   [id number]                 [id number]  2  [id number]                   [id number]  [id number] ---------------------------------------------------------------------- Indications   Placenta previa specified as without           O44.03  hemorrhage, third trimester  Echogenic intracardiac focus of the heart      O35.8XX0  (EIF)  Encounter for antenatal screening for          Z36.3  malformations  Asthma                                         O99.89 j45.909  Other mental disorder complicating             O99.340  pregnancy, second trimester  [redacted] weeks gestation of pregnancy                Z3A.29 ---------------------------------------------------------------------- Fetal Evaluation  Num Of Fetuses:         1  Fetal Heart Rate(bpm):  148  Cardiac Activity:       Observed  Presentation:           Cephalic  Placenta:               Anterior previa  P. Cord Insertion:      Visualized, central  Amniotic Fluid  AFI FV:      Within normal limits  AFI Sum(cm)     %Tile       Largest Pocket(cm)  16.32           59          6.41  RUQ(cm)       RLQ(cm)       LUQ(cm)        LLQ(cm)  5             2.79          6.41           2.12 ---------------------------------------------------------------------- Biometry  BPD:     75.01  mm     G. Age:  30w 1d         45  %    CI:        76.95   %    70 - 86                                                          FL/HC:      19.2   %    19.2 - 21.4  HC:    270.81   mm     G. Age:  29w 4d         11  %    HC/AC:      1.04        0.99 - 1.21  AC:      260.4  mm     G. Age:  30w 1d         55  %    FL/BPD:     69.4   %    71 - 87  FL:      52.02  mm     G. Age:  27w 5d        2.2  %    FL/AC:      20.0   %  20 - 24  HUM:        47  mm     G. Age:  27w 5d          7  %  LV:        5.2  mm  Est. FW:    1375  gm      3 lb 1 oz     21  % ---------------------------------------------------------------------- OB History  Blood Type:   A+  Gravidity:    2         Term:   0        Prem:   0        SAB:   1  TOP:          0       Ectopic:  0        Living: 0 ---------------------------------------------------------------------- Gestational Age  LMP:           33w 1d        Date:   04/29/21                 EDD:   02/03/22  U/S Today:     29w 3d                                        EDD:   03/01/22  Best:          29w 6d     Det. By:  Early Exam  (07/04/21)   EDD:   02/26/22 ---------------------------------------------------------------------- Anatomy  Cranium:               Appears normal         LVOT:                   Previously seen  Cavum:                 Appears normal         Aortic Arch:            Appears normal  Ventricles:            Appears normal         Ductal Arch:            Previously seen  Choroid Plexus:        Appears normal         Diaphragm:              Appears normal  Cerebellum:            Previously seen        Stomach:                Appears normal, left                                                                        sided  Posterior Fossa:       Previously seen        Abdomen:                Appears normal  Nuchal Fold:  Previously seen        Abdominal Wall:         Previously seen  Face:                  Orbits and profile     Cord Vessels:           Previously seen                         previously seen  Lips:                  Previously seen        Kidneys:                Appear normal  Palate:                Not well visualized    Bladder:                Appears normal  Thoracic:              Previously seen        Spine:                  Previously seen  Heart:                 Appears normal; EIF    Upper Extremities:      prev Visualized  RVOT:                  Previously seen        Lower Extremities:      Previously seen  Other:  Patient does not want to know the sex of the baby. Fetus is female.          3VC, 3VV and 3VTV prev visualized. Nasal bone and Lenses prev          visualized. Technically difficult due to fetal position. ---------------------------------------------------------------------- Cervix Uterus Adnexa  Cervix  Appears closed, without funnelling.  Uterus  No abnormality visualized.  Right Ovary  Not visualized.  Left Ovary   Not visualized.  Cul De Sac  No free fluid seen.  Adnexa  No adnexal mass visualized. ---------------------------------------------------------------------- Comments  This patient was seen due to placenta previa noted on her  prior exam.  She denies any problems since her last exam.  The patient tested positive for hepatitis C antibodies.  However her HCV quantification was negative, indicating that  she may have cleared the hepatitis C infection.  She was informed that the fetal growth and amniotic fluid  level appears appropriate for her gestational age.  A low-lying placenta measuring 1.8 cm away from the internal  cervical os continues to be noted today.  This was confirmed  using transvaginal ultrasound.  The patient was reassured  that the placenta will most likely move away from the lower  uterine segment prior to delivery.  Due to the low-lying placenta noted today, a follow-up exam  was scheduled in 5 weeks. ----------------------------------------------------------------------                  Johnell Comings, MD Electronically Signed Final Report   12/17/2021 01:13 pm ----------------------------------------------------------------------  [place] MFM OB Transvaginal  Result Date: 12/17/2021 ----------------------------------------------------------------------  OBSTETRICS REPORT                       (Signed Final 12/17/2021 01:13 pm) ---------------------------------------------------------------------- Patient Info  ID #:       [id number]                          D.O.B.:  Sep 29, 1990 (30 yrs)  Name:       Autumn Stephenson [facility]               Visit Date: 12/17/2021 07:42 am ---------------------------------------------------------------------- Performed By  Attending:        Johnell Comings MD         Secondary Phy.:   Donnamae Jude                                                             MD  Performed By:     Germain Osgood            Address:          [address]                                                             [place], [place]  Referred By:      [place]          Location:         [facility]                                Fetal Care at                                                             [facility]  Ref. Address:     [address]                    [place], [place] ---------------------------------------------------------------------- Orders  #  Description                           Code        Ordered By  1  [place] MFM OB FOLLOW UP                   [id number]    RAVI SHANKAR  2  [place] MFM OB TRANSVAGINAL                [id number]     RAVI [facility] ----------------------------------------------------------------------  #  Order #                     Accession #                Episode #  1  [id number]                   [id number]                 [id number]  2  [id number]                   [id number]                 [id number] ---------------------------------------------------------------------- Indications  Placenta previa specified as without           O44.03  hemorrhage, third trimester  Echogenic intracardiac focus of the heart      O35.8XX0  (EIF)  Encounter for antenatal screening for          Z36.3  malformations  Asthma                                         O99.89 j45.909  Other mental disorder complicating             O99.340  pregnancy, second trimester  [redacted] weeks gestation of pregnancy                Z3A.29 ---------------------------------------------------------------------- Fetal Evaluation  Num Of Fetuses:         1  Fetal Heart Rate(bpm):  148  Cardiac Activity:       Observed  Presentation:           Cephalic  Placenta:               Anterior previa  P. Cord Insertion:      Visualized, central  Amniotic Fluid  AFI FV:       Within normal limits  AFI Sum(cm)     %Tile       Largest Pocket(cm)  16.32           59          6.41  RUQ(cm)       RLQ(cm)       LUQ(cm)        LLQ(cm)  5             2.79  6.41           2.12 ---------------------------------------------------------------------- Biometry  BPD:     75.01  mm     G. Age:  30w 1d         45  %    CI:        76.95   %    70 - 86                                                          FL/HC:      19.2   %    19.2 - 21.4  HC:    270.81   mm     G. Age:  29w 4d         11  %    HC/AC:      1.04        0.99 - 1.21  AC:      260.4  mm     G. Age:  30w 1d         55  %    FL/BPD:     69.4   %    71 - 87  FL:      52.02  mm     G. Age:  27w 5d        2.2  %    FL/AC:      20.0   %    20 - 24  HUM:        47  mm     G. Age:  27w 5d          7  %  LV:        5.2  mm  Est. FW:    1375  gm      3 lb 1 oz     21  % ---------------------------------------------------------------------- OB History  Blood Type:   A+  Gravidity:    2         Term:   0        Prem:   0        SAB:   1  TOP:          0       Ectopic:  0        Living: 0 ---------------------------------------------------------------------- Gestational Age  LMP:           33w 1d        Date:  04/29/21                 EDD:   02/03/22  U/S Today:     29w 3d                                        EDD:   03/01/22  Best:          29w 6d     Det. By:  Early Exam  (07/04/21)   EDD:   02/26/22 ---------------------------------------------------------------------- Anatomy  Cranium:               Appears normal         LVOT:  Previously seen  Cavum:                 Appears normal         Aortic Arch:            Appears normal  Ventricles:            Appears normal         Ductal Arch:            Previously seen  Choroid Plexus:        Appears normal         Diaphragm:              Appears normal  Cerebellum:            Previously seen        Stomach:                Appears normal, left                                                                         sided  Posterior Fossa:       Previously seen        Abdomen:                Appears normal  Nuchal Fold:           Previously seen        Abdominal Wall:         Previously seen  Face:                  Orbits and profile     Cord Vessels:           Previously seen                         previously seen  Lips:                  Previously seen        Kidneys:                Appear normal  Palate:                Not well visualized    Bladder:                Appears normal  Thoracic:              Previously seen        Spine:                  Previously seen  Heart:                 Appears normal; EIF    Upper Extremities:      prev Visualized  RVOT:                  Previously seen        Lower Extremities:      Previously seen  Other:  Patient does not want to know the sex of the baby. Fetus is female.          3VC, 3VV and 3VTV  prev visualized. Nasal bone and Lenses prev          visualized. Technically difficult due to fetal position. ---------------------------------------------------------------------- Cervix Uterus Adnexa  Cervix  Appears closed, without funnelling.  Uterus  No abnormality visualized.  Right Ovary  Not visualized.  Left Ovary  Not visualized.  Cul De Sac  No free fluid seen.  Adnexa  No adnexal mass visualized. ---------------------------------------------------------------------- Comments  This patient was seen due to placenta previa noted on her  prior exam.  She denies any problems since her last exam.  The patient tested positive for hepatitis C antibodies.  However her HCV quantification was negative, indicating that  she may have cleared the hepatitis C infection.  She was informed that the fetal growth and amniotic fluid  level appears appropriate for her gestational age.  A low-lying placenta measuring 1.8 cm away from the internal  cervical os continues to be noted today.  This was confirmed  using transvaginal ultrasound.  The patient was reassured   that the placenta will most likely move away from the lower  uterine segment prior to delivery.  Due to the low-lying placenta noted today, a follow-up exam  was scheduled in 5 weeks. ----------------------------------------------------------------------                  Johnell Comings, MD Electronically Signed Final Report   12/17/2021 01:13 pm ----------------------------------------------------------------------   Assessment and Plan:  Pregnancy: G2P0010 at 65w1d1. Supervision of high risk pregnancy in third trimester - Felling well, frequent vigorous fetal movement  2. [redacted] weeks gestation of pregnancy - Anticipatory guidance provided for routine follow-up care  3. Low-lying placenta - follow-up U/S scheduled  4. HCV antibody positive -HCV RNA(quant) negative  5. Low back pain during pregnancy in third trimester - recommended maternity belly support band, if symptoms don't improve, patient will work with Pelvic Floor PT - Ambulatory referral to Physical Therapy  6. Indigestion - has exhausted OTC options with no improvement - pantoprazole (PROTONIX) 40 MG tablet; Take 1 tablet (40 mg total) by mouth daily.  Dispense: 30 tablet; Refill: 0  Preterm labor symptoms and general obstetric precautions including but not limited to vaginal bleeding, contractions, leaking of fluid and fetal movement were reviewed in detail with the patient. I discussed the assessment and treatment plan with the patient. The patient was provided an opportunity to ask questions and all were answered. The patient agreed with the plan and demonstrated an understanding of the instructions. The patient was advised to call back or seek an in-person office evaluation/[facility]. Please refer to After Visit Summary for other counseling recommendations.   I provided 20 minutes of face-to-face time during this encounter.  Return in about 2 weeks (around  01/16/2022) [facility]  Future Appointments  Date Time Provider [place] 01/16/2022  9:15 AM [facility] 01/21/2022  9:15 AM [facility] NURSE [facility] 01/21/2022  9:30 AM [facility] 01/30/2022  9:35 AM WGabriel Carina CNM [facility] 02/06/2022  1:35 PM [facility] 02/13/2022  1:35 PM NCaren Macadam MD [facility]

## 2022-01-16 ENCOUNTER — Ambulatory Visit (INDEPENDENT_AMBULATORY_CARE_PROVIDER_SITE_OTHER): Payer: 59 | Admitting: Certified Nurse Midwife

## 2022-01-16 ENCOUNTER — Other Ambulatory Visit: Payer: Self-pay

## 2022-01-16 VITALS — BP 111/81 | HR 81 | Wt 151.6 lb

## 2022-01-16 DIAGNOSIS — Z3A34 34 weeks gestation of pregnancy: Secondary | ICD-10-CM

## 2022-01-16 DIAGNOSIS — Z3493 Encounter for supervision of normal pregnancy, unspecified, third trimester: Secondary | ICD-10-CM

## 2022-01-16 DIAGNOSIS — F902 Attention-deficit hyperactivity disorder, combined type: Secondary | ICD-10-CM

## 2022-01-16 NOTE — Progress Notes (Signed)
   PRENATAL VISIT NOTE  Subjective:  Autumn Stephenson is a 31 y.o. G2P0010 at [redacted]w[redacted]d being seen today for ongoing prenatal care.  She is currently monitored for the following issues for this high-risk pregnancy and has BRCA gene mutation negative; Insomnia; Hypermobility syndrome; Strain of calf muscle; Supervision of high risk pregnancy, antepartum; ADHD (attention deficit hyperactivity disorder); and HCV antibody positive on their problem list.  Patient reports  feeling physically well but is concerned about decreasing her ADHD meds (Vyvanse/Seroquel daily) as suggested by her psychiatrist. She was told she "could" decrease her meds to avoid withdrawal in the baby but only if she found it necessary. Tried the decreased Seroquel dose and is tolerating it well but is not tolerating her decreased Vyvanse dose of $Remov'20mg'GBAjSH$  daily .  Contractions: Not present. Vag. Bleeding: None.  Movement: Present. Denies leaking of fluid.   The following portions of the patient's history were reviewed and updated as appropriate: allergies, current medications, past family history, past medical history, past social history, past surgical history and problem list.   Objective:   Vitals:   01/16/22 0948  BP: 111/81  Pulse: 81  Weight: 151 lb 9.6 oz (68.8 kg)   Fetal Status: Fetal Heart Rate (bpm): 132 Fundal Height: 34 cm Movement: Present  Presentation: Vertex  General:  Alert, oriented and cooperative. Patient is in no acute distress.  Skin: Skin is warm and dry. No rash noted.   Cardiovascular: Normal heart rate noted  Respiratory: Normal respiratory effort, no problems with respiration noted  Abdomen: Soft, gravid, appropriate for gestational age.  Pain/Pressure: Present     Pelvic: Cervical exam deferred        Extremities: Normal range of motion.  Edema: None  Mental Status: Normal mood and affect. Normal behavior. Normal judgment and thought content.   Assessment and Plan:  Pregnancy: G2P0010 at [redacted]w[redacted]d 1.  Encounter for supervision of low-risk pregnancy in third trimester - Doing well, feeling regular and vigorous fetal movement   2. [redacted] weeks gestation of pregnancy - Routine OB care including anticipatory guidance re GBS testing at next visit  3. Attention deficit hyperactivity disorder (ADHD), combined type - Advised that all data pertaining to amphetamine use in pregnancy is related to methamphetamine abuse at much higher doses. Lower daily prescribed doses are thought to be less harmful. - Discussed possible withdrawal symptoms in baby and importance of breastfeeding/STS contact etc for baby - Discussed use of meds while breastfeeding - Used shared decision making to review options, pt desires to keep working dose of Vyvanse and continue lower dose of Seroquel.  Preterm labor symptoms and general obstetric precautions including but not limited to vaginal bleeding, contractions, leaking of fluid and fetal movement were reviewed in detail with the patient. Please refer to After Visit Summary for other counseling recommendations.   Return in about 2 weeks (around 01/30/2022) for IN-PERSON, LOB/GBS.  Future Appointments  Date Time Provider [place]  01/21/2022  9:15 AM [facility] NURSE [facility]  01/21/2022  9:30 AM [facility]  01/30/2022  9:35 AM Gabriel Carina, CNM [facility]  02/06/2022  1:35 PM Helaine Chess [facility]  02/13/2022  1:35 PM Caren Macadam, MD [facility]    Gabriel Carina, CNM

## 2022-01-21 ENCOUNTER — Ambulatory Visit: Payer: 59 | Admitting: *Deleted

## 2022-01-21 ENCOUNTER — Ambulatory Visit: Payer: 59 | Attending: Obstetrics

## 2022-01-21 VITALS — BP 132/79 | HR 79

## 2022-01-21 DIAGNOSIS — O99333 Smoking (tobacco) complicating pregnancy, third trimester: Secondary | ICD-10-CM | POA: Insufficient documentation

## 2022-01-21 DIAGNOSIS — O4403 Placenta previa specified as without hemorrhage, third trimester: Secondary | ICD-10-CM | POA: Insufficient documentation

## 2022-01-21 DIAGNOSIS — J45909 Unspecified asthma, uncomplicated: Secondary | ICD-10-CM | POA: Diagnosis not present

## 2022-01-21 DIAGNOSIS — Z3A01 Less than 8 weeks gestation of pregnancy: Secondary | ICD-10-CM | POA: Insufficient documentation

## 2022-01-21 DIAGNOSIS — Z3A34 34 weeks gestation of pregnancy: Secondary | ICD-10-CM | POA: Diagnosis not present

## 2022-01-21 DIAGNOSIS — O4400 Placenta previa specified as without hemorrhage, unspecified trimester: Secondary | ICD-10-CM | POA: Insufficient documentation

## 2022-01-21 DIAGNOSIS — Z363 Encounter for antenatal screening for malformations: Secondary | ICD-10-CM | POA: Insufficient documentation

## 2022-01-21 DIAGNOSIS — O283 Abnormal ultrasonic finding on antenatal screening of mother: Secondary | ICD-10-CM | POA: Insufficient documentation

## 2022-01-21 DIAGNOSIS — O35BXX0 Maternal care for other (suspected) fetal abnormality and damage, fetal cardiac anomalies, not applicable or unspecified: Secondary | ICD-10-CM

## 2022-01-21 DIAGNOSIS — O99513 Diseases of the respiratory system complicating pregnancy, third trimester: Secondary | ICD-10-CM | POA: Diagnosis not present

## 2022-01-21 DIAGNOSIS — O358XX0 Maternal care for other (suspected) fetal abnormality and damage, not applicable or unspecified: Secondary | ICD-10-CM | POA: Diagnosis not present

## 2022-01-21 DIAGNOSIS — O0990 Supervision of high risk pregnancy, unspecified, unspecified trimester: Secondary | ICD-10-CM

## 2022-01-21 DIAGNOSIS — Z3491 Encounter for supervision of normal pregnancy, unspecified, first trimester: Secondary | ICD-10-CM | POA: Insufficient documentation

## 2022-01-21 DIAGNOSIS — R768 Other specified abnormal immunological findings in serum: Secondary | ICD-10-CM | POA: Insufficient documentation

## 2022-01-30 ENCOUNTER — Other Ambulatory Visit: Payer: Self-pay

## 2022-01-30 ENCOUNTER — Other Ambulatory Visit (HOSPITAL_COMMUNITY)
Admission: RE | Admit: 2022-01-30 | Discharge: 2022-01-30 | Disposition: A | Discharge status: Home / Self Care | Payer: 59 | Source: Ambulatory Visit | Attending: Certified Nurse Midwife | Admitting: Certified Nurse Midwife

## 2022-01-30 ENCOUNTER — Ambulatory Visit (INDEPENDENT_AMBULATORY_CARE_PROVIDER_SITE_OTHER): Payer: 59 | Admitting: Certified Nurse Midwife

## 2022-01-30 VITALS — BP 110/86 | HR 90 | Wt 151.4 lb

## 2022-01-30 DIAGNOSIS — Z3A36 36 weeks gestation of pregnancy: Secondary | ICD-10-CM

## 2022-01-30 DIAGNOSIS — Z3493 Encounter for supervision of normal pregnancy, unspecified, third trimester: Secondary | ICD-10-CM | POA: Insufficient documentation

## 2022-01-30 DIAGNOSIS — O0993 Supervision of high risk pregnancy, unspecified, third trimester: Secondary | ICD-10-CM

## 2022-01-30 DIAGNOSIS — O321XX0 Maternal care for breech presentation, not applicable or unspecified: Secondary | ICD-10-CM

## 2022-01-30 NOTE — Progress Notes (Signed)
   PRENATAL VISIT NOTE  Subjective:  Autumn Stephenson is a 31 y.o. G2P0010 at [redacted]w[redacted]d being seen today for ongoing prenatal care.  She is currently monitored for the following issues for this low-risk pregnancy and has BRCA gene mutation negative; Insomnia; Hypermobility syndrome; Strain of calf muscle; Supervision of high risk pregnancy, antepartum; ADHD (attention deficit hyperactivity disorder); and HCV antibody positive on their problem list.  Patient reports no complaints.  Contractions: Not present. Vag. Bleeding: None.  Movement: Present. Denies leaking of fluid.   The following portions of the patient's history were reviewed and updated as appropriate: allergies, current medications, past family history, past medical history, past social history, past surgical history and problem list.   Objective:   Vitals:   01/30/22 1019  BP: 110/86  Pulse: 90  Weight: 151 lb 6.4 oz (68.7 kg)    Fetal Status: Fetal Heart Rate (bpm): 136 Fundal Height: 36 cm Movement: Present  Presentation: Complete Breech  General:  Alert, oriented and cooperative. Patient is in no acute distress.  Skin: Skin is warm and dry. No rash noted.   Cardiovascular: Normal heart rate noted  Respiratory: Normal respiratory effort, no problems with respiration noted  Abdomen: Soft, gravid, appropriate for gestational age.  Pain/Pressure: Present     Pelvic: Cervical exam deferred        Extremities: Normal range of motion.  Edema: None  Mental Status: Normal mood and affect. Normal behavior. Normal judgment and thought content.   Assessment and Plan:  Pregnancy: G2P0010 at [redacted]w[redacted]d 1. Supervision of high risk pregnancy in third trimester - Doing well, feeling regular and vigorous fetal movement   2. [redacted] weeks gestation of pregnancy - Routine OB care  - GC/Chlamydia probe amp ([place])not at [facility] - Culture, beta strep (group b only)  3. Breech presentation - Pt expressed concern about fetal positioning,  thought baby was vertex at previous visits but never confirmed with U/S - Leopold's today = fetal head to maternal right, confirmed with bedside u/s - Discussed possibility of ECV and reviewed procedure, pros/cons with pt and FOB - Pt wants to avoid CS if possible and desires to discuss ECV further  - Rebooked for appt on Monday with Dr. Kennon Rounds  Preterm labor symptoms and general obstetric precautions including but not limited to vaginal bleeding, contractions, leaking of fluid and fetal movement were reviewed in detail with the patient. Please refer to After Visit Summary for other counseling recommendations.   Return in about 1 week (around 02/06/2022) for IN-PERSON, LOB.  Future Appointments  Date Time Provider [place]  02/04/2022  3:35 PM Donnamae Jude, MD [facility]  02/06/2022  1:35 PM Helaine Chess [facility]  02/13/2022  1:35 PM Caren Macadam, MD [facility]    Gabriel Carina, CNM

## 2022-01-31 LAB — GC/CHLAMYDIA PROBE AMP (~~LOC~~) NOT AT ARMC
Chlamydia: NEGATIVE
Comment: NEGATIVE
Comment: NORMAL
Neisseria Gonorrhea: NEGATIVE

## 2022-02-03 LAB — CULTURE, BETA STREP (GROUP B ONLY): Strep Gp B Culture: NEGATIVE

## 2022-02-04 ENCOUNTER — Other Ambulatory Visit: Payer: Self-pay

## 2022-02-04 ENCOUNTER — Ambulatory Visit (INDEPENDENT_AMBULATORY_CARE_PROVIDER_SITE_OTHER): Payer: 59 | Admitting: Family Medicine

## 2022-02-04 VITALS — BP 130/87 | HR 120 | Wt 154.1 lb

## 2022-02-04 DIAGNOSIS — O0990 Supervision of high risk pregnancy, unspecified, unspecified trimester: Secondary | ICD-10-CM

## 2022-02-04 NOTE — Progress Notes (Signed)
   PRENATAL VISIT NOTE  Subjective:  Autumn Stephenson is a 31 y.o. G2P0010 at 68w6dbeing seen today for ongoing prenatal care.  She is currently monitored for the following issues for this low-risk pregnancy and has BRCA gene mutation negative; Insomnia; Hypermobility syndrome; Strain of calf muscle; Supervision of high risk pregnancy, antepartum; ADHD (attention deficit hyperactivity disorder); and HCV antibody positive on their problem list.  Patient reports no complaints.  Contractions: Not present. Vag. Bleeding: None.  Movement: Present. Denies leaking of fluid.   The following portions of the patient's history were reviewed and updated as appropriate: allergies, current medications, past family history, past medical history, past social history, past surgical history and problem list.   Objective:   Vitals:   02/04/22 1557  BP: 130/87  Pulse: (!) 120  Weight: 154 lb 1.6 oz (69.9 kg)    Fetal Status: Fetal Heart Rate (bpm): 146 Fundal Height: 38 cm Movement: Present  Presentation: Complete Breech  General:  Alert, oriented and cooperative. Patient is in no acute distress.  Skin: Skin is warm and dry. No rash noted.   Cardiovascular: Normal heart rate noted  Respiratory: Normal respiratory effort, no problems with respiration noted  Abdomen: Soft, gravid, appropriate for gestational age.  Pain/Pressure: Absent     Pelvic: Cervical exam deferred        Extremities: Normal range of motion.  Edema: None  Mental Status: Normal mood and affect. Normal behavior. Normal judgment and thought content.   Assessment and Plan:  Pregnancy: G2P0010 at 341w6d. Supervision of high risk pregnancy, antepartum GBS negative  2. Breech presentation For ECV--risks reviewed. Orders placed  Term labor symptoms and general obstetric precautions including but not limited to vaginal bleeding, contractions, leaking of fluid and fetal movement were reviewed in detail with the patient. Please refer  to After Visit Summary for other counseling recommendations.   Return in 1 week (on 02/11/2022).  Future Appointments  Date Time Provider DeNara Visa8/23/2023  8:00 AM [facility] None  02/06/2022  1:[facility]/30/2023  1:35 PM [facility]  TaDonnamae JudeMD

## 2022-02-05 ENCOUNTER — Encounter (HOSPITAL_COMMUNITY): Payer: Self-pay | Admitting: *Deleted

## 2022-02-05 ENCOUNTER — Telehealth (HOSPITAL_COMMUNITY): Payer: Self-pay | Admitting: *Deleted

## 2022-02-05 NOTE — Telephone Encounter (Signed)
Preadmission screen  

## 2022-02-05 NOTE — H&P (View-Only) (Signed)
OBSTETRIC ADMISSION HISTORY AND PHYSICAL  Autumn Stephenson is a 30 y.o. female G2P0010 with IUP at [redacted]w[redacted]d by 6wk US presenting for ECV due to fetal malpresentation. She reports +FMs, No LOF, no VB, no blurry vision, headaches or peripheral edema, and RUQ pain.  She plans on breast feeding. She request OCPs for birth control. She received her prenatal care at [facility]   Dating: By 6 week US --->  Estimated Date of Delivery: 02/26/22  Sono:    @[redacted]w[redacted]d, CWD, normal anatomy, transverse presentation, 2653g, 61% EFW   Prenatal History/Complications:  HCV Ab positive - HCV RNA undetectable  Past Medical History: Past Medical History:  Diagnosis Date   Asthma    Seizures (HCC)    last episode 1 year    Past Surgical History: Past Surgical History:  Procedure Laterality Date   DILATION AND EVACUATION N/A 09/12/2020   Procedure: DILATATION AND EVACUATION;  Surgeon: Kulwa, Ema, MD;  Location: [facility] OR;  Service: Gynecology;  Laterality: N/A;   ORIF CLAVICULAR FRACTURE     TONSILLECTOMY  06/17/1997    Obstetrical History: OB History     Gravida  2   Para      Term      Preterm      AB  1   Living  0      SAB  1   IAB      Ectopic      Multiple      Live Births  0           Social History Social History   Socioeconomic History   Marital status: Married    Spouse name: Not on file   Number of children: Not on file   Years of education: Not on file   Highest education level: Not on file  Occupational History   Not on file  Tobacco Use   Smoking status: Every Day    Packs/day: 0.50    Years: 10.00    Total pack years: 5.00    Types: Cigarettes   Smokeless tobacco: Never  Vaping Use   Vaping Use: Never used  Substance and Sexual Activity   Alcohol use: Yes    Alcohol/week: 6.0 standard drinks of alcohol    Types: 3 Glasses of wine, 3 Cans of beer per week   Drug use: Never   Sexual activity: Yes  Other Topics Concern   Not on file  Social History  Narrative   Not on file   Social Determinants of Health   Financial Resource Strain: Not on file  Food Insecurity: No Food Insecurity (02/04/2022)   Hunger Vital Sign    Worried About Running Out of Food in the Last Year: Never true    Ran Out of Food in the Last Year: Never true  Transportation Needs: No Transportation Needs (02/04/2022)   PRAPARE - Transportation    Lack of Transportation (Medical): No    Lack of Transportation (Non-Medical): No  Physical Activity: Not on file  Stress: Not on file  Social Connections: Not on file    Family History: Family History  Problem Relation Age of Onset   Cancer Mother    Diabetes Father    Cancer Maternal Grandmother    Alzheimer's disease Paternal Grandmother     Allergies: No Known Allergies  (Not in a hospital admission)    Review of Systems   All systems reviewed and negative except as stated in HPI  Last menstrual period 04/29/2021. General appearance:   alert, cooperative, and no distress Lungs: clear to auscultation bilaterally Heart: regular rate and rhythm Abdomen: soft, non-tender; bowel sounds normal Extremities: Homans sign is negative, no sign of DVT  Presentation:  oblique by [place], head on maternal right Fetal monitoringBaseline: 130 bpm, Variability: Good {> 6 bpm), and Accelerations: Reactive   Prenatal labs: ABO, Rh: A/Positive/-- (01/18 0000) Antibody: Negative (01/18 0000) Rubella: Immune (01/18 0000) RPR: Non Reactive (06/22 0937)  HBsAg: Negative (01/18 0000)  HIV: Non Reactive (06/22 0937)  GBS: Negative/-- (08/16 1134)  2 hr Glucola 74/149/89 Genetic screening  LR NIPS Anatomy US WNL  Prenatal Transfer Tool  Maternal Diabetes: No Genetic Screening: Normal Maternal Ultrasounds/Referrals: Normal Fetal Ultrasounds or other Referrals:  None Maternal Substance Abuse:  No Significant Maternal Medications:  None Significant Maternal Lab Results: Group B Strep negative  No results found for this  or any previous visit (from the past 24 hour(s)).  Patient Active Problem List   Diagnosis Date Noted   Supervision of high risk pregnancy, antepartum 09/28/2021   ADHD (attention deficit hyperactivity disorder) 09/28/2021   HCV antibody positive 09/28/2021   Hypermobility syndrome 06/07/2021   Strain of calf muscle 06/07/2021   BRCA gene mutation negative 06/08/2019   Insomnia 06/08/2019    Assessment/Plan:  Autumn Stephenson is a 57 y.o. G2P0010 at 61w0dhere for ECV  Pt presents to L&D for ECV due to fetal malpresentation.  A reactive FHT was obtained prior to procedure.    Will give terb prior to procedure after NST reactive and after comfortable with regional anesthesia.  May have epidural or spinal prior to procedure if she would prefer. Discussed may increase her chance of success. She would like to proceed with this.   Risks of ECV including abnormal fetal heart rate, PROM, abruption, injury to fetus, possible emergency c-section, and failed ECV were discussed with the pt and informed consent was obtained.  Informed consent was also obtained for c-section.    RH status: Positive  PRadene Gunning MD  02/05/2022, 3:02 PM

## 2022-02-05 NOTE — H&P (Signed)
OBSTETRIC ADMISSION HISTORY AND PHYSICAL  Autumn Stephenson is a 31 y.o. female G2P0010 with IUP at [redacted]w[redacted]d by 6wk [place] presenting for ECV due to fetal malpresentation. She reports +FMs, No LOF, no VB, no blurry vision, headaches or peripheral edema, and RUQ pain.  She plans on breast feeding. She request OCPs for birth control. She received her prenatal care at [facility]   Dating: By 6 week [place] --->  Estimated Date of Delivery: 02/26/22  Sono:    @[redacted]w[redacted]d , CWD, normal anatomy, transverse presentation, 2653g, 61% EFW   Prenatal History/Complications:  HCV Ab positive - HCV RNA undetectable  Past Medical History: Past Medical History:  Diagnosis Date   Asthma    Seizures (HCC)    last episode 1 year    Past Surgical History: Past Surgical History:  Procedure Laterality Date   DILATION AND EVACUATION N/A 09/12/2020   Procedure: DILATATION AND EVACUATION;  Surgeon: Hoover Browns, MD;  Location: [facility] OR;  Service: Gynecology;  Laterality: N/A;   ORIF CLAVICULAR FRACTURE     TONSILLECTOMY  06/17/1997    Obstetrical History: OB History     Gravida  2   Para      Term      Preterm      AB  1   Living  0      SAB  1   IAB      Ectopic      Multiple      Live Births  0           Social History Social History   Socioeconomic History   Marital status: Married    Spouse name: Not on file   Number of children: Not on file   Years of education: Not on file   Highest education level: Not on file  Occupational History   Not on file  Tobacco Use   Smoking status: Every Day    Packs/day: 0.50    Years: 10.00    Total pack years: 5.00    Types: Cigarettes   Smokeless tobacco: Never  Vaping Use   Vaping Use: Never used  Substance and Sexual Activity   Alcohol use: Yes    Alcohol/week: 6.0 standard drinks of alcohol    Types: 3 Glasses of wine, 3 Cans of beer per week   Drug use: Never   Sexual activity: Yes  Other Topics Concern   Not on file  Social History  Narrative   Not on file   Social Determinants of Health   Financial Resource Strain: Not on file  Food Insecurity: No Food Insecurity (02/04/2022)   Hunger Vital Sign    Worried About Running Out of Food in the Last Year: Never true    Ran Out of Food in the Last Year: Never true  Transportation Needs: No Transportation Needs (02/04/2022)   PRAPARE - Administrator, Civil Service (Medical): No    Lack of Transportation (Non-Medical): No  Physical Activity: Not on file  Stress: Not on file  Social Connections: Not on file    Family History: Family History  Problem Relation Age of Onset   Cancer Mother    Diabetes Father    Cancer Maternal Grandmother    Alzheimer's disease Paternal Grandmother     Allergies: No Known Allergies  (Not in a hospital admission)    Review of Systems   All systems reviewed and negative except as stated in HPI  Last menstrual period 04/29/2021. General appearance:  alert, cooperative, and no distress Lungs: clear to auscultation bilaterally Heart: regular rate and rhythm Abdomen: soft, non-tender; bowel sounds normal Extremities: Homans sign is negative, no sign of DVT  Presentation: {desc; fetal presentation:14558} Fetal monitoring{findings; monitor fetal heart monitor:31527} Uterine activity{Uterine contractions:31516}   Prenatal labs: ABO, Rh: A/Positive/-- (01/18 0000) Antibody: Negative (01/18 0000) Rubella: Immune (01/18 0000) RPR: Non Reactive (06/22 0937)  HBsAg: Negative (01/18 0000)  HIV: Non Reactive (06/22 0937)  GBS: Negative/-- (08/16 1134)  2 hr Glucola 74/149/89 Genetic screening  LR NIPS Anatomy US WNL  Prenatal Transfer Tool  Maternal Diabetes: No Genetic Screening: Normal Maternal Ultrasounds/Referrals: Normal Fetal Ultrasounds or other Referrals:  None Maternal Substance Abuse:  No Significant Maternal Medications:  None Significant Maternal Lab Results: Group B Strep negative  No results  found for this or any previous visit (from the past 24 hour(s)).  Patient Active Problem List   Diagnosis Date Noted   Supervision of high risk pregnancy, antepartum 09/28/2021   ADHD (attention deficit hyperactivity disorder) 09/28/2021   HCV antibody positive 09/28/2021   Hypermobility syndrome 06/07/2021   Strain of calf muscle 06/07/2021   BRCA gene mutation negative 06/08/2019   Insomnia 06/08/2019    Assessment/Plan:  Autumn Stephenson is a 31 y.o. G2P0010 at [redacted]w[redacted]d here for ECV  Pt presents to L&D for ECV due to fetal malpresentation.  A reactive FHT will be obtained prior to procedure.   Will give terb prior to procedure after NST reactive May have epidural prior to procedure if she would prefer. Discussed may increase her chance of success. *** Risks of ECV including abnormal fetal heart rate, PROM, abruption, injury to fetus, possible emergency c-section, and failed ECV were discussed with the pt and informed consent was obtained.  Informed consent was also obtained for c-section.    RH status: Positive  Milas Hock, MD  02/05/2022, 3:02 PM

## 2022-02-06 ENCOUNTER — Encounter (HOSPITAL_COMMUNITY): Payer: Self-pay

## 2022-02-06 ENCOUNTER — Telehealth (INDEPENDENT_AMBULATORY_CARE_PROVIDER_SITE_OTHER): Payer: 59 | Admitting: Certified Nurse Midwife

## 2022-02-06 ENCOUNTER — Observation Stay (HOSPITAL_COMMUNITY)
Admission: AD | Admit: 2022-02-06 | Discharge: 2022-02-06 | Disposition: A | Discharge status: Home / Self Care | Payer: 59 | Attending: Obstetrics and Gynecology | Admitting: Obstetrics and Gynecology

## 2022-02-06 ENCOUNTER — Other Ambulatory Visit: Payer: Self-pay

## 2022-02-06 ENCOUNTER — Encounter (HOSPITAL_COMMUNITY): Payer: Self-pay | Admitting: Obstetrics and Gynecology

## 2022-02-06 ENCOUNTER — Observation Stay (HOSPITAL_COMMUNITY): Payer: 59 | Admitting: Anesthesiology

## 2022-02-06 ENCOUNTER — Observation Stay (HOSPITAL_COMMUNITY)
Admission: RE | Admit: 2022-02-06 | Discharge: 2022-02-06 | Disposition: A | Discharge status: Home / Self Care | Payer: 59 | Source: Ambulatory Visit | Attending: Obstetrics and Gynecology | Admitting: Obstetrics and Gynecology

## 2022-02-06 DIAGNOSIS — O329XX0 Maternal care for malpresentation of fetus, unspecified, not applicable or unspecified: Secondary | ICD-10-CM

## 2022-02-06 DIAGNOSIS — O0990 Supervision of high risk pregnancy, unspecified, unspecified trimester: Secondary | ICD-10-CM

## 2022-02-06 DIAGNOSIS — J45909 Unspecified asthma, uncomplicated: Secondary | ICD-10-CM | POA: Insufficient documentation

## 2022-02-06 DIAGNOSIS — O99513 Diseases of the respiratory system complicating pregnancy, third trimester: Secondary | ICD-10-CM | POA: Diagnosis not present

## 2022-02-06 DIAGNOSIS — F17210 Nicotine dependence, cigarettes, uncomplicated: Secondary | ICD-10-CM | POA: Diagnosis not present

## 2022-02-06 DIAGNOSIS — O321XX0 Maternal care for breech presentation, not applicable or unspecified: Principal | ICD-10-CM | POA: Insufficient documentation

## 2022-02-06 DIAGNOSIS — O99333 Smoking (tobacco) complicating pregnancy, third trimester: Secondary | ICD-10-CM | POA: Insufficient documentation

## 2022-02-06 DIAGNOSIS — Z3A37 37 weeks gestation of pregnancy: Secondary | ICD-10-CM

## 2022-02-06 DIAGNOSIS — R768 Other specified abnormal immunological findings in serum: Secondary | ICD-10-CM

## 2022-02-06 DIAGNOSIS — O0993 Supervision of high risk pregnancy, unspecified, third trimester: Secondary | ICD-10-CM

## 2022-02-06 LAB — TYPE AND SCREEN
ABO/RH(D): A POS
Antibody Screen: NEGATIVE

## 2022-02-06 LAB — CBC
HCT: 39.1 % (ref 36.0–46.0)
Hemoglobin: 13.4 g/dL (ref 12.0–15.0)
MCH: 30.2 pg (ref 26.0–34.0)
MCHC: 34.3 g/dL (ref 30.0–36.0)
MCV: 88.3 fL (ref 80.0–100.0)
Platelets: 155 10*3/uL (ref 150–400)
RBC: 4.43 MIL/uL (ref 3.87–5.11)
RDW: 12.3 % (ref 11.5–15.5)
WBC: 7.5 10*3/uL (ref 4.0–10.5)
nRBC: 0 % (ref 0.0–0.2)

## 2022-02-06 MED ORDER — EPHEDRINE 5 MG/ML INJ
10.0000 mg | INTRAVENOUS | Status: DC | PRN
  Administered 2022-02-06: 10 mg via INTRAVENOUS

## 2022-02-06 MED ORDER — PHENYLEPHRINE 80 MCG/ML (10ML) SYRINGE FOR IV PUSH (FOR BLOOD PRESSURE SUPPORT)
80.0000 ug | PREFILLED_SYRINGE | INTRAVENOUS | Status: DC | PRN
Start: 2022-02-06 — End: 2022-02-06

## 2022-02-06 MED ORDER — ONDANSETRON HCL 4 MG/2ML IJ SOLN
4.0000 mg | Freq: Four times a day (QID) | INTRAMUSCULAR | Status: DC | PRN
  Administered 2022-02-06: 4 mg via INTRAVENOUS

## 2022-02-06 MED ORDER — DIPHENHYDRAMINE HCL 50 MG/ML IJ SOLN: 12.5000 mg | INTRAMUSCULAR | Status: DC | PRN

## 2022-02-06 MED ORDER — LIDOCAINE-EPINEPHRINE (PF) 2 %-1:200000 IJ SOLN
INTRAMUSCULAR | Status: DC | PRN
  Administered 2022-02-06 (×3): 5 mL via EPIDURAL

## 2022-02-06 MED ORDER — EPHEDRINE 5 MG/ML INJ
INTRAVENOUS | Status: AC
  Filled 2022-02-06: qty 5

## 2022-02-06 MED ORDER — SODIUM CHLORIDE 0.9 % IV SOLN: INTRAVENOUS | Status: DC

## 2022-02-06 MED ORDER — TERBUTALINE SULFATE 1 MG/ML IJ SOLN
INTRAMUSCULAR | Status: AC
  Filled 2022-02-06: qty 1

## 2022-02-06 MED ORDER — LACTATED RINGERS IV SOLN: 500.0000 mL | Freq: Once | INTRAVENOUS | Status: DC

## 2022-02-06 MED ORDER — EPHEDRINE 5 MG/ML INJ: 10.0000 mg | INTRAVENOUS | Status: DC | PRN

## 2022-02-06 MED ORDER — ONDANSETRON HCL 4 MG/2ML IJ SOLN
INTRAMUSCULAR | Status: AC
  Filled 2022-02-06: qty 2

## 2022-02-06 MED ORDER — PHENYLEPHRINE 80 MCG/ML (10ML) SYRINGE FOR IV PUSH (FOR BLOOD PRESSURE SUPPORT)
PREFILLED_SYRINGE | INTRAVENOUS | Status: AC
  Filled 2022-02-06: qty 10

## 2022-02-06 MED ORDER — TERBUTALINE SULFATE 1 MG/ML IJ SOLN
0.2500 mg | Freq: Once | INTRAMUSCULAR | Status: AC
  Administered 2022-02-06: 0.25 mg via SUBCUTANEOUS

## 2022-02-06 MED ORDER — LACTATED RINGERS IV SOLN
INTRAVENOUS | Status: DC
Start: 2022-02-06 — End: 2022-02-06

## 2022-02-06 MED ORDER — TERBUTALINE SULFATE 1 MG/ML IJ SOLN: 0.2500 mg | Freq: Once | INTRAMUSCULAR | Status: DC

## 2022-02-06 NOTE — Anesthesia Preprocedure Evaluation (Signed)
Anesthesia Evaluation  Patient identified by MRN, date of birth, ID band Patient awake    Reviewed: Allergy & Precautions, H&P , NPO status , Patient's Chart, lab work & pertinent test results  Airway Mallampati: II   Neck ROM: full    Dental   Pulmonary asthma , Current Smoker,    breath sounds clear to auscultation       Cardiovascular negative cardio ROS   Rhythm:regular Rate:Normal     Neuro/Psych Seizures -,   Neuromuscular disease    GI/Hepatic   Endo/Other    Renal/GU      Musculoskeletal   Abdominal   Peds  Hematology   Anesthesia Other Findings   Reproductive/Obstetrics (+) Pregnancy                             Anesthesia Physical Anesthesia Plan  ASA: 2  Anesthesia Plan: Spinal   Post-op Pain Management:    Induction: Intravenous  PONV Risk Score and Plan: 1 and Treatment may vary due to age or medical condition  Airway Management Planned: Natural Airway  Additional Equipment:   Intra-op Plan:   Post-operative Plan:   Informed Consent: I have reviewed the patients History and Physical, chart, labs and discussed the procedure including the risks, benefits and alternatives for the proposed anesthesia with the patient or authorized representative who has indicated his/her understanding and acceptance.     Dental advisory given  Plan Discussed with: CRNA, Anesthesiologist and Surgeon  Anesthesia Plan Comments:         Anesthesia Quick Evaluation

## 2022-02-06 NOTE — Discharge Summary (Signed)
Antenatal Physician Discharge Summary  Patient ID: Autumn Stephenson MRN: [id number] DOB/AGE: [birth year] 31 y.o.  Admit date: 02/06/2022 Discharge date: 02/06/2022  Admission Diagnoses: Fetal malpresentation [O32.9XX0] Pregnancy at [redacted] weeks gestation  Discharge Diagnoses:  Same  Prenatal Procedures: NST and ECV, Spinal anesthesia  Consults: Anesthesia  Hospital Course:  Autumn Stephenson is a 31 y.o. G2P0010 with IUP at [redacted]w[redacted]d who was admitted for observation for ECV. She had a spinal placed. She had her ECV which proceeded without complication. NST and CEFM after was reactive. I repeated her [place] to confirm persistent cephalic position again and it was still cephalic.   Discharge Exam: Temp:  [98.1 F (36.7 C)-98.5 F (36.9 C)] 98.5 F (36.9 C) (08/23 1015) Pulse Rate:  [59-112] 67 (08/23 1215) Resp:  [16-20] 20 (08/23 1215) BP: (99-120)/(59-80) 102/67 (08/23 1215) SpO2:  [97 %-100 %] 100 % (08/23 1215) Weight:  [70.3 kg] 70.3 kg (08/23 0910) Physical Examination: CONSTITUTIONAL: Well-developed, well-nourished female in no acute distress.  HENT:  Normocephalic, atraumatic, External right and left ear normal.  EYES: Conjunctivae and EOM are normal. Pupils are equal, round, and reactive to light. No scleral icterus.  NECK: Normal range of motion, supple, no masses SKIN: Skin is warm and dry. No rash noted. Not diaphoretic. No erythema. No pallor. NEUROLOGIC: Alert and oriented to person, place, and time. Normal reflexes, muscle tone coordination. No cranial nerve deficit noted. PSYCHIATRIC: Normal mood and affect. Normal behavior. Normal judgment and thought content. CARDIOVASCULAR: Normal heart rate noted, regular rhythm RESPIRATORY: Effort and breath sounds normal, no problems with respiration noted MUSCULOSKELETAL: Normal range of motion. No edema and no tenderness. 2+ distal pulses. ABDOMEN: Soft, nontender, nondistended, gravid.   Fetal monitoring: FHR: 140 bpm,  Variability: moderate, Accelerations: Present, Decelerations: Absent  Uterine activity: none  Significant Diagnostic Studies:  Results for orders placed or performed during the hospital encounter of 02/06/22 (from the past 168 hour(s))  Type and screen   Collection Time: 02/06/22  9:01 AM  Result Value Ref Range   ABO/RH(D) A POS    Antibody Screen NEG    Sample Expiration      02/09/2022,2359 Performed at [facility], [address], [place], [place] [address]   CBC   Collection Time: 02/06/22  9:02 AM  Result Value Ref Range   WBC 7.5 4.0 - 10.5 K/uL   RBC 4.43 3.87 - 5.11 MIL/uL   Hemoglobin 13.4 12.0 - 15.0 g/dL   HCT 57.3 22.0 - 25.4 %   MCV 88.3 80.0 - 100.0 fL   MCH 30.2 26.0 - 34.0 pg   MCHC 34.3 30.0 - 36.0 g/dL   RDW 27.0 62.3 - 76.2 %   Platelets 155 150 - 400 K/uL   nRBC 0.0 0.0 - 0.2 %   [place] MFM OB FOLLOW UP  Result Date: 01/21/2022 ----------------------------------------------------------------------  OBSTETRICS REPORT                       (Signed Final 01/21/2022 10:25 am) ---------------------------------------------------------------------- Patient Info  ID #:       [id number]                          D.O.B.:  [birth year] (31 yrs)  Name:       Autumn Stephenson [facility]               Visit Date: 01/21/2022 09:33 am ---------------------------------------------------------------------- Performed By  Attending:  Johnell Comings MD         Secondary Phy.:   Donnamae Jude                                                             MD  Performed By:     Hubert Azure          Address:          [address]                                                             [place], [place]  Referred By:      [place]          [facility]         [facility]                                [facility] at                                                              [facility]                                                             Women  Ref. Address:     [address]                    [place], [place] ---------------------------------------------------------------------- Orders  #  Description                           Code        Ordered By  1  [place] MFM OB FOLLOW UP                   [id number]    Peterson Ao ----------------------------------------------------------------------  #  Order #  Accession #                Episode #  1  [id number]                   [id number]                 [id number] ---------------------------------------------------------------------- Indications  Placenta previa specified as without           O44.03  hemorrhage, third trimester  Echogenic intracardiac focus of the heart      O35.8XX0  (EIF)  [redacted] weeks gestation of pregnancy                Z3A.34  Encounter for antenatal screening for          Z36.3  malformations  Asthma                                         O99.89 j45.909 ---------------------------------------------------------------------- Fetal Evaluation  Num Of Fetuses:         1  Fetal Heart Rate(bpm):  146  Cardiac Activity:       Observed  Presentation:           Transverse, head to maternal right  Placenta:               Anterior  Amniotic Fluid  AFI FV:      Within normal limits  AFI Sum(cm)     %Tile       Largest Pocket(cm)  22.94           87          8.76  RUQ(cm)       RLQ(cm)       LUQ(cm)        LLQ(cm)  8.76          6.37          4.75           3.06 ---------------------------------------------------------------------- Biometry  BPD:     84.49  mm     G. Age:  34w 0d         28  %    CI:        72.07   %    70 - 86                                                          FL/HC:      20.1   %    20.1 - 22.3  HC:    316.71   mm     G. Age:  35w 4d         33  %    HC/AC:      0.97        0.93 - 1.11  AC:    327.85   mm     G. Age:  36w 5d         94  %    FL/BPD:     75.4   %     71 - 87  FL:      63.73  mm     G. Age:  33w 0d  6  %    FL/AC:      19.4   %    20 - 24  HUM:      54.5  mm     G. Age:  31w 5d        < 5  %  LV:        3.8  mm  Est. FW:    2653  gm    5 lb 14 oz      61  % ---------------------------------------------------------------------- OB History  Blood Type:   A+  Gravidity:    2         Term:   0        Prem:   0        SAB:   1  TOP:          0       Ectopic:  0        Living: 0 ---------------------------------------------------------------------- Gestational Age  LMP:           38w 1d        Date:  04/29/21                 EDD:   02/03/22  U/S Today:     34w 6d                                        EDD:   02/26/22  Best:          34w 6d     Det. By:  Early Exam  (07/04/21)   EDD:   02/26/22 ---------------------------------------------------------------------- Anatomy  Cranium:               Appears normal         LVOT:                   Previously seen  Cavum:                 Appears normal         Aortic Arch:            Previously seen  Ventricles:            Appears normal         Ductal Arch:            Previously seen  Choroid Plexus:        Previously seen        Diaphragm:              Appears normal  Cerebellum:            Previously seen        Stomach:                Appears normal, left                                                                        sided  Posterior Fossa:       Previously seen        Abdomen:  Appears normal  Nuchal Fold:           Previously seen        Abdominal Wall:         Previously seen  Face:                  Orbits and profile     Cord Vessels:           Previously seen                         previously seen  Lips:                  Previously seen        Kidneys:                Appear normal  Palate:                Not well visualized    Bladder:                Appears normal  Thoracic:              Previously seen        Spine:                  Previously seen  Heart:                 Previously seen         Upper Extremities:      prev Visualized  RVOT:                  Previously seen        Lower Extremities:      Previously seen  Other:  Patient does not want to know the sex of the baby. Fetus is female.          3VC, 3VV and 3VTV prev visualized. Nasal bone and Lenses prev          visualized. Technically difficult due to fetal position. ---------------------------------------------------------------------- Cervix Uterus Adnexa  Cervix  Length:            5.2  cm.  Normal appearance by transabdominal scan.  Uterus  No abnormality visualized.  Right Ovary  Not visualized.  Left Ovary  Not visualized.  Adnexa  No abnormality visualized. ---------------------------------------------------------------------- Comments  The patient has been followed for placenta previa/low-lying  placenta that was noted during her prior exams. She denies  any problems since her last visit.  The fetal growth and amniotic fluid level were appropriate for  her gestational age.  The fetus was in the breech presentation today.  The previously noted placenta previa/low-lying placenta has  resolved.  The patient was advised that she may continue routine  prenatal care and attempt a vaginal delivery at term (as long  as the fetus is no longer in the breech presentation).  As the placenta previa has resolved, no further exams were  scheduled in our office. ----------------------------------------------------------------------                  Johnell Comings, MD Electronically Signed Final Report   01/21/2022 10:25 am ----------------------------------------------------------------------   Future Appointments  Date Time Provider [place]  02/06/2022  1:35 PM [facility]aine [facility]  02/13/2022  1:35 PM Caren Macadam, MD [facility]    Discharge Condition: Stable  Discharge disposition: 01-Home or Self Care  Discharge Instructions     Activity as tolerated - No restrictions   Complete by: As  directed    Call MD for:   Complete by: As directed    Decreased fetal movement, contractions, and vaginal bleeding.   Call MD for:  difficulty breathing, headache or visual disturbances   Complete by: As directed    Call MD for:  persistant nausea and vomiting   Complete by: As directed    Call MD for:  redness, tenderness, or signs of infection (pain, swelling, redness, odor or green/yellow discharge around incision site)   Complete by: As directed    Call MD for:  severe uncontrolled pain   Complete by: As directed    Call MD for:  temperature >100.4   Complete by: As directed    Diet general   Complete by: As directed    May shower / Bathe   Complete by: As directed       Allergies as of 02/06/2022   No Known Allergies      Medication List     TAKE these medications    Magnesium 200 MG Tabs Take 1 tablet by mouth 2 (two) times daily.   Magnesium Oxide -Mg Supplement 200 MG Tabs Commonly known as: Mag-Oxide Take 2 tablets (400 mg total) by mouth at bedtime. If that amount causes loose stools in the am, switch to 200mg  daily at bedtime.   pantoprazole 40 MG tablet Commonly known as: Protonix Take 1 tablet (40 mg total) by mouth daily.   prenatal multivitamin Tabs tablet Take 1 tablet by mouth daily at 12 noon.   QUEtiapine 100 MG tablet Commonly known as: SEROQUEL Take 100 mg by mouth at bedtime.   Vyvanse 20 MG capsule Generic drug: lisdexamfetamine Take 20 mg by mouth daily. What changed: Another medication with the same name was removed. Continue taking this medication, and follow the directions you see here.        Follow-up [place] [facility] Follow up on 02/13/2022.   Specialty: Obstetrics and Gynecology Contact information: [place] [phone]                Total discharge time: 15 minutes   Signed: Radene Gunning  M.D. 02/06/2022, 12:53 PM

## 2022-02-06 NOTE — Progress Notes (Signed)
Appt cancelled. Pt seen 02/04/22 for routine OB. Seen this AM at hospital for ECV.   Fleet Contras RN 02/06/22

## 2022-02-06 NOTE — Op Note (Signed)
Date of procedure: 02/06/22  Pre-procedure diagnosis: 1. Single live intrauterine pregnancy at [redacted]w[redacted]d 2. Breech presentation  Post-procedure diagnosis: 1. Single live intrauterine pregnancy at [redacted]w[redacted]d 2. Cephalic presentation  Procedure: External Cephalic Version Anesthesia: Epidural Surgeon: Dr. Milas Hock Assistant: Dr. Randa Evens Autry-Lott. An experienced assistant was required given the standard of surgical care given the complexity of the case.  This assistant was needed for overall help during the procedure (for example to hold the fetal buttock in place).  Findings: Fetus in oblique presentation and head on maternal right; stable, reactive fetal heart rate pre and post procedure EBL: N/A  IVF: N/A  UOP: N/A Complications: None  Indication for procedure: Pt presents to L&D for ECV due to fetal malpresentation.  A reactive FHT was obtained prior to procedure.  Risks of ECV including abnormal fetal heart rate, PROM, abruption, injury to fetus, possible emergency c-section, and failed ECV were discussed with the pt and informed consent was obtained.  Informed consent was also obtained for c-section.  Pt elected for spinal prior to the procedure and this was placed without complication.  Details of Procedure: A bedside ultrasound was performed which confirmed the single intrauterine pregnancy and oblique presentation.  There was noted to be adequate fluid. Preoperatively, she was given terbutaline and spinal anesthesia was found to be adequate. Using manual pressure, the fetus was manipulated with gentle pressure from the palms against the buttock and fetal head to stimulate a backward roll.  One attempt was made for version and it was successful. FHT reactive after attempted version.  The patient and fetus tolerated the procedure well. Bladder will be emptied now and abdominal binder will be placed.   RH status: Positive  Milas Hock, MD Attending Obstetrician & Gynecologist, [facility], [facility]

## 2022-02-06 NOTE — Anesthesia Procedure Notes (Signed)
Epidural Patient location during procedure: OB Start time: 02/06/2022 9:50 AM End time: 02/06/2022 9:56 AM  Staffing Anesthesiologist: Achille Rich, MD Performed: anesthesiologist   Preanesthetic Checklist Completed: patient identified, IV checked, site marked, risks and benefits discussed, monitors and equipment checked, pre-op evaluation and timeout performed  Epidural Patient position: sitting Prep: DuraPrep Patient monitoring: heart rate, cardiac monitor, continuous pulse ox and blood pressure Approach: midline Location: L2-L3 Injection technique: LOR saline  Needle:  Needle type: Tuohy  Needle gauge: 17 G Needle length: 9 cm Needle insertion depth: 5 cm Catheter type: closed end flexible Catheter size: 19 Gauge Catheter at skin depth: 11 cm Test dose: negative and Other  Assessment Events: blood not aspirated, injection not painful, no injection resistance and negative IV test  Additional Notes Informed consent obtained prior to proceeding including risk of failure, 1% risk of PDPH, risk of minor discomfort and bruising.  Discussed rare but serious complications including epidural abscess, permanent nerve injury, epidural hematoma.  Discussed alternatives to epidural analgesia and patient desires to proceed.  Timeout performed pre-procedure verifying patient name, procedure, and platelet count.  Patient tolerated procedure well. Reason for block:procedure for pain

## 2022-02-06 NOTE — Progress Notes (Signed)
Abd blinder applied after version

## 2022-02-06 NOTE — Interval H&P Note (Signed)
History and Physical Interval Note:  02/06/2022 12:45 PM  Autumn Stephenson  has presented today for surgery, with the diagnosis of * No surgery found *.  The various methods of treatment have been discussed with the patient and family. After consideration of risks, benefits and other options for treatment, the patient has consented to  ECV as a surgical intervention.  The patient's history has been reviewed, patient examined, no change in status, stable for surgery.  I have reviewed the patient's chart and labs.  Questions were answered to the patient's satisfaction.     Milas Hock

## 2022-02-07 NOTE — Anesthesia Postprocedure Evaluation (Signed)
Anesthesia Post Note  Patient: Autumn Stephenson  Procedure(s) Performed: VERSION        Patient location during evaluation: PACU Anesthesia Type: Epidural Level of consciousness: awake and alert Pain management: pain level controlled Vital Signs Assessment: post-procedure vital signs reviewed and stable Respiratory status: spontaneous breathing, nonlabored ventilation and respiratory function stable Cardiovascular status: stable Postop Assessment: no headache, no backache and epidural receding Anesthetic complications: no   No notable events documented.  Last Vitals: There were no vitals filed for this visit.  Last Pain: There were no vitals filed for this visit.               Linsie Lupo S

## 2022-02-13 ENCOUNTER — Other Ambulatory Visit: Payer: Self-pay

## 2022-02-13 ENCOUNTER — Ambulatory Visit (INDEPENDENT_AMBULATORY_CARE_PROVIDER_SITE_OTHER): Payer: 59 | Admitting: Family Medicine

## 2022-02-13 VITALS — BP 131/91 | HR 98 | Wt 155.3 lb

## 2022-02-13 DIAGNOSIS — Z3A38 38 weeks gestation of pregnancy: Secondary | ICD-10-CM

## 2022-02-13 DIAGNOSIS — O0990 Supervision of high risk pregnancy, unspecified, unspecified trimester: Secondary | ICD-10-CM

## 2022-02-13 DIAGNOSIS — O0993 Supervision of high risk pregnancy, unspecified, third trimester: Secondary | ICD-10-CM

## 2022-02-13 DIAGNOSIS — O329XX0 Maternal care for malpresentation of fetus, unspecified, not applicable or unspecified: Secondary | ICD-10-CM

## 2022-02-13 NOTE — Progress Notes (Signed)
   PRENATAL VISIT NOTE  Subjective:  Autumn Stephenson is a 31 y.o. G2P0010 at 98w1dbeing seen today for ongoing prenatal care.  She is currently monitored for the following issues for this high-risk pregnancy and has BRCA gene mutation negative; Insomnia; Hypermobility syndrome; Strain of calf muscle; Supervision of high risk pregnancy, antepartum; ADHD (attention deficit hyperactivity disorder); HCV antibody positive; and Fetal malpresentation on their problem list.  Patient reports no complaints and reports successful version last week but has felt fetus flip .  Contractions: Not present. Vag. Bleeding: None.  Movement: Present. Denies leaking of fluid.   The following portions of the patient's history were reviewed and updated as appropriate: allergies, current medications, past family history, past medical history, past social history, past surgical history and problem list.   Objective:   Vitals:   02/13/22 1357 02/13/22 1454  BP: (!) 135/94 (!) 131/91  Pulse: 98   Weight: 155 lb 4.8 oz (70.4 kg)     Fetal Status: Fetal Heart Rate (bpm): 152   Movement: Present  Presentation: Transverse  General:  Alert, oriented and cooperative. Patient is in no acute distress.  Skin: Skin is warm and dry. No rash noted.   Cardiovascular: Normal heart rate noted  Respiratory: Normal respiratory effort, no problems with respiration noted  Abdomen: Soft, gravid, appropriate for gestational age.  Pain/Pressure: Present     Pelvic: Cervical exam deferred        Extremities: Normal range of motion.  Edema: None  Mental Status: Normal mood and affect. Normal behavior. Normal judgment and thought content.   Assessment and Plan:  Pregnancy: G2P0010 at [redacted]w[redacted]d. Supervision of high risk pregnancy, antepartum Doing well Vigorous movement Thinks baby moved out of head down--she was RIGHT!  2. Malpresentation before onset of labor, single or unspecified fetus Transverse, fetal head in LLQ today on  BSUS Recommend repeat USKorean 9/5 and can obviously cancel all the appts for ECV/IOL if vtx Discussed in depth options for repeat ECV, repeat ECV with binder, ECV with IOL Patient and partner discussed options and asked questions Opted for repeat ECV with IOL after if successful Scheduled both in clinic and orders in signed held.   Preterm labor symptoms and general obstetric precautions including but not limited to vaginal bleeding, contractions, leaking of fluid and fetal movement were reviewed in detail with the patient. Please refer to After Visit Summary for other counseling recommendations.   Return in about 1 week (around 02/20/2022) for Routine prenatal care.  Future Appointments  Date Time Provider [place]9/10/2021 10:55 AM SmMany[facility] SilvasCNMallory [facility]MS[facility]/12/2021  7:30 AM [facility] SCHED ROOM [facility] None  02/21/2022  8:00 AM [facility] SCHED ROOM [facility] None   ECV scheduled for   KiCaren MacadamMD

## 2022-02-14 ENCOUNTER — Encounter (HOSPITAL_COMMUNITY): Payer: Self-pay | Admitting: *Deleted

## 2022-02-14 ENCOUNTER — Telehealth (HOSPITAL_COMMUNITY): Payer: Self-pay | Admitting: *Deleted

## 2022-02-14 NOTE — Telephone Encounter (Signed)
Preadmission screen  

## 2022-02-15 ENCOUNTER — Inpatient Hospital Stay (HOSPITAL_COMMUNITY)
Admission: AD | Admit: 2022-02-15 | Discharge: 2022-02-15 | Disposition: A | Discharge status: Home / Self Care | Payer: 59 | Attending: Obstetrics & Gynecology | Admitting: Obstetrics & Gynecology

## 2022-02-15 ENCOUNTER — Encounter: Payer: Self-pay | Admitting: Family Medicine

## 2022-02-15 ENCOUNTER — Encounter (HOSPITAL_COMMUNITY): Payer: Self-pay | Admitting: Obstetrics & Gynecology

## 2022-02-15 DIAGNOSIS — Z3A38 38 weeks gestation of pregnancy: Secondary | ICD-10-CM | POA: Insufficient documentation

## 2022-02-15 DIAGNOSIS — Z0371 Encounter for suspected problem with amniotic cavity and membrane ruled out: Secondary | ICD-10-CM | POA: Insufficient documentation

## 2022-02-15 DIAGNOSIS — O479 False labor, unspecified: Secondary | ICD-10-CM

## 2022-02-15 LAB — COMPREHENSIVE METABOLIC PANEL
ALT: 18 U/L (ref 0–44)
AST: 24 U/L (ref 15–41)
Albumin: 2.9 g/dL — ABNORMAL LOW (ref 3.5–5.0)
Alkaline Phosphatase: 158 U/L — ABNORMAL HIGH (ref 38–126)
Anion gap: 6 (ref 5–15)
BUN: 11 mg/dL (ref 6–20)
CO2: 23 mmol/L (ref 22–32)
Calcium: 8.9 mg/dL (ref 8.9–10.3)
Chloride: 108 mmol/L (ref 98–111)
Creatinine, Ser: 0.8 mg/dL (ref 0.44–1.00)
GFR, Estimated: >60 mL/min (ref >60)
Glucose, Bld: 86 mg/dL (ref 70–99)
Potassium: 4.2 mmol/L (ref 3.5–5.1)
Sodium: 137 mmol/L (ref 135–145)
Total Bilirubin: 0.3 mg/dL (ref 0.3–1.2)
Total Protein: 6.2 g/dL — ABNORMAL LOW (ref 6.5–8.1)

## 2022-02-15 LAB — AMNISURE RUPTURE OF MEMBRANE (ROM) NOT AT ARMC: Amnisure ROM: NEGATIVE

## 2022-02-15 LAB — POCT FERN TEST: POCT Fern Test: NEGATIVE

## 2022-02-15 LAB — PROTEIN / CREATININE RATIO, URINE
Creatinine, Urine: 55 mg/dL
Total Protein, Urine: <6 mg/dL

## 2022-02-15 LAB — CBC
HCT: 38.5 % (ref 36.0–46.0)
Hemoglobin: 13.2 g/dL (ref 12.0–15.0)
MCH: 29.5 pg (ref 26.0–34.0)
MCHC: 34.3 g/dL (ref 30.0–36.0)
MCV: 86.1 fL (ref 80.0–100.0)
Platelets: 160 10*3/uL (ref 150–400)
RBC: 4.47 MIL/uL (ref 3.87–5.11)
RDW: 12.3 % (ref 11.5–15.5)
WBC: 8.3 10*3/uL (ref 4.0–10.5)
nRBC: 0 % (ref 0.0–0.2)

## 2022-02-15 NOTE — MAU Note (Signed)
.  Autumn Stephenson is a 31 y.o. at [redacted]w[redacted]d here in MAU reporting: she was driving to work and felt wetness. Stated she has felt like she is still leaking some. Denies any ctx or bleeding . Good fetal movement  LMP:  Onset of complaint: 1100 Pain score: 0 There were no vitals filed for this visit.   FHT:150 Lab orders placed from triage:

## 2022-02-15 NOTE — MAU Provider Note (Signed)
History     CSN: [id number]  Arrival date and time: 02/15/22 1327   Event Date/Time   First Provider Initiated Contact with Patient 02/15/22 1540      Chief Complaint  Patient presents with   Rupture of Membranes   Patient presenting today after noticing what she was concerned to be leaking fluids.  She reports that she noticed fluid leaking which continued for some time so she was concerned that her water is broken.  She does report that she is try to urinate on multiple occasions and been unsuccessful at this.  Feels like the baby remains in transverse lie.  Denies any bleeding, contractions.  Reports good fetal movement.    OB History     Gravida  2   Para      Term      Preterm      AB  1   Living  0      SAB  1   IAB      Ectopic      Multiple      Live Births  0           Past Medical History:  Diagnosis Date   Asthma    Complication of anesthesia    epidural made her nauseated   Syncope     Past Surgical History:  Procedure Laterality Date   DILATION AND EVACUATION N/A 09/12/2020   Procedure: DILATATION AND EVACUATION;  Surgeon: Hoover Browns, MD;  Location: [facility];  Service: Gynecology;  Laterality: N/A;   ORIF CLAVICULAR FRACTURE     TONSILLECTOMY  06/17/1997    Family History  Problem Relation Age of Onset   Cancer Mother    Diabetes Father    Cancer Maternal Grandmother    Alzheimer's disease Paternal Grandmother     Social History   Tobacco Use   Smoking status: Former    Packs/day: 0.50    Years: 10.00    Total pack years: 5.00    Types: Cigarettes    Quit date: 03/06/2021    Years since quitting: 0.9   Smokeless tobacco: Never  Vaping Use   Vaping Use: Never used  Substance Use Topics   Alcohol use: Yes    Alcohol/week: 6.0 standard drinks of alcohol    Types: 3 Glasses of wine, 3 Cans of beer per week   Drug use: Never    Allergies: No Known Allergies  No medications prior to admission.    Review of Systems   Eyes:  Negative for visual disturbance.  Respiratory:  Negative for shortness of breath.   Cardiovascular:  Negative for chest pain.  Gastrointestinal:  Negative for abdominal pain.  Neurological:  Negative for headaches.   Physical Exam   Blood pressure (!) 111/97, pulse 90, temperature 98.2 F (36.8 C), resp. rate 18, height 5\' 2"  (1.575 m), weight 69.4 kg, last menstrual period 04/29/2021.  Physical Exam Constitutional:      Appearance: Normal appearance.  HENT:     Head: Normocephalic and atraumatic.     Nose: No congestion.  Eyes:     Extraocular Movements: Extraocular movements intact.  Cardiovascular:     Rate and Rhythm: Normal rate and regular rhythm.  Pulmonary:     Effort: Pulmonary effort is normal.  Abdominal:     Comments: Gravid  Musculoskeletal:        General: Normal range of motion.  Skin:    General: Skin is warm.     Capillary Refill:  Capillary refill takes less than 2 seconds.  Neurological:     General: No focal deficit present.     Mental Status: She is alert.  Psychiatric:        Mood and Affect: Mood normal.     MAU Course  Procedures  MDM Concern for ROM. Fern test negative AmniSure negative  Elevated blood pressure previous OB visit.  Normotensive since arrival with no other elevated blood pressure.  Prelabs negative today.  Assessment and Plan  Patient presenting for concern of rupture of membranes.  Fetus is transverse lie.  Fern test negative.  AmniSure negative.  Fetal heart tracing reassuring category 1 with baseline of 135, moderate variability, accelerations present, decelerations absent.  No uterine contractions appreciated.  It was noted that patient had an elevated blood pressure previous office visit but patient remained normotensive throughout her hospital stay.  Preeclampsia labs were collected and were negative.  Patient was discharged home with close follow-up.  Scheduled to see provider in OB clinic on Tuesday of next week  and scheduled for repeat version on Thursday of next week.  Discussed strict return precautions including but not limited to a gush of fluid, vaginal bleeding, decreased fetal movement, regular contractions. Patient discharged home.   [name] [name] 02/15/2022, 4:09 PM

## 2022-02-16 ENCOUNTER — Other Ambulatory Visit: Payer: Self-pay | Admitting: Advanced Practice Midwife

## 2022-02-19 ENCOUNTER — Other Ambulatory Visit: Payer: Self-pay

## 2022-02-19 ENCOUNTER — Ambulatory Visit (INDEPENDENT_AMBULATORY_CARE_PROVIDER_SITE_OTHER): Payer: 59 | Admitting: Advanced Practice Midwife

## 2022-02-19 VITALS — BP 127/78 | HR 89 | Wt 156.7 lb

## 2022-02-19 DIAGNOSIS — O321XX0 Maternal care for breech presentation, not applicable or unspecified: Secondary | ICD-10-CM

## 2022-02-19 DIAGNOSIS — Z3A39 39 weeks gestation of pregnancy: Secondary | ICD-10-CM

## 2022-02-19 DIAGNOSIS — O0990 Supervision of high risk pregnancy, unspecified, unspecified trimester: Secondary | ICD-10-CM

## 2022-02-19 DIAGNOSIS — R768 Other specified abnormal immunological findings in serum: Secondary | ICD-10-CM

## 2022-02-19 NOTE — Progress Notes (Signed)
   PRENATAL VISIT NOTE  Subjective:  Autumn Stephenson is a 31 y.o. G2P0010 at 71w0dbeing seen today for ongoing prenatal care.  She is currently monitored for the following issues for this low-risk pregnancy and has BRCA gene mutation negative; Insomnia; Hypermobility syndrome; Strain of calf muscle; Supervision of high risk pregnancy, antepartum; ADHD (attention deficit hyperactivity disorder); HCV antibody positive; and Fetal malpresentation on their problem list.  Patient reports no complaints.  Contractions: Not present. Vag. Bleeding: None.  Movement: Present. Denies leaking of fluid.   The following portions of the patient's history were reviewed and updated as appropriate: allergies, current medications, past family history, past medical history, past social history, past surgical history and problem list.   Objective:   Vitals:   02/19/22 1112  BP: 127/78  Pulse: 89  Weight: 156 lb 11.2 oz (71.1 kg)    Fetal Status: Fetal Heart Rate (bpm): 144 Fundal Height: 39 cm Movement: Present  Presentation: Complete Breech  General:  Alert, oriented and cooperative. Patient is in no acute distress.  Skin: Skin is warm and dry. No rash noted.   Cardiovascular: Normal heart rate noted  Respiratory: Normal respiratory effort, no problems with respiration noted  Abdomen: Soft, gravid, appropriate for gestational age.  Pain/Pressure: Absent     Pelvic: Cervical exam deferred      Breech by BS [place]   Extremities: Normal range of motion.  Edema: None  Mental Status: Normal mood and affect. Normal behavior. Normal judgment and thought content.   Assessment and Plan:  Pregnancy: G2P0010 at 377w0d. Breech presentation, single or unspecified fetus ECV scheduled 02/21/22  2. Supervision of high risk pregnancy, antepartum   3. [redacted] weeks gestation of pregnancy   4. HCV antibody positive - Quant neg, C/W resolved past infection or false pos. Repeated Quant neg.   Term labor symptoms and  general obstetric precautions including but not limited to vaginal bleeding, contractions, leaking of fluid and fetal movement were reviewed in detail with the patient. Please refer to After Visit Summary for other counseling recommendations.   Return in about 6 weeks (around 04/02/2022) for Postpartum visit.  Future Appointments  Date Time Provider [place]9/12/2021  7:30 AM [facility]  02/21/2022  8:00 AM [facility] [place] None  03/27/2022  1:35 PM [facility]riel [facility]inaCN[facility]

## 2022-02-21 ENCOUNTER — Inpatient Hospital Stay (HOSPITAL_COMMUNITY): Payer: 59

## 2022-02-21 ENCOUNTER — Other Ambulatory Visit: Payer: Self-pay

## 2022-02-21 ENCOUNTER — Encounter (HOSPITAL_COMMUNITY): Payer: Self-pay | Admitting: Anesthesiology

## 2022-02-21 ENCOUNTER — Inpatient Hospital Stay (HOSPITAL_COMMUNITY): Admission: AD | Admit: 2022-02-21 | Payer: 59 | Source: Home / Self Care | Admitting: Obstetrics & Gynecology

## 2022-02-21 ENCOUNTER — Inpatient Hospital Stay (HOSPITAL_COMMUNITY)
Admission: AD | Admit: 2022-02-21 | Discharge: 2022-02-26 | DRG: 787 | Disposition: A | Discharge status: Home / Self Care | Payer: 59 | Attending: Obstetrics & Gynecology | Admitting: Obstetrics & Gynecology

## 2022-02-21 ENCOUNTER — Encounter (HOSPITAL_COMMUNITY): Payer: Self-pay | Admitting: Obstetrics & Gynecology

## 2022-02-21 DIAGNOSIS — O322XX0 Maternal care for transverse and oblique lie, not applicable or unspecified: Secondary | ICD-10-CM | POA: Diagnosis not present

## 2022-02-21 DIAGNOSIS — O43213 Placenta accreta, third trimester: Secondary | ICD-10-CM | POA: Diagnosis not present

## 2022-02-21 DIAGNOSIS — O329XX0 Maternal care for malpresentation of fetus, unspecified, not applicable or unspecified: Secondary | ICD-10-CM

## 2022-02-21 DIAGNOSIS — Z3A39 39 weeks gestation of pregnancy: Secondary | ICD-10-CM

## 2022-02-21 DIAGNOSIS — O9902 Anemia complicating childbirth: Secondary | ICD-10-CM | POA: Diagnosis present

## 2022-02-21 DIAGNOSIS — Z87891 Personal history of nicotine dependence: Secondary | ICD-10-CM | POA: Diagnosis not present

## 2022-02-21 DIAGNOSIS — O321XX0 Maternal care for breech presentation, not applicable or unspecified: Secondary | ICD-10-CM | POA: Diagnosis present

## 2022-02-21 DIAGNOSIS — O321XX1 Maternal care for breech presentation, fetus 1: Secondary | ICD-10-CM | POA: Diagnosis not present

## 2022-02-21 DIAGNOSIS — O0990 Supervision of high risk pregnancy, unspecified, unspecified trimester: Secondary | ICD-10-CM

## 2022-02-21 LAB — RPR: RPR Ser Ql: NONREACTIVE

## 2022-02-21 LAB — CBC
HCT: 37.2 % (ref 36.0–46.0)
Hemoglobin: 13 g/dL (ref 12.0–15.0)
MCH: 30.5 pg (ref 26.0–34.0)
MCHC: 34.9 g/dL (ref 30.0–36.0)
MCV: 87.3 fL (ref 80.0–100.0)
Platelets: 150 10*3/uL (ref 150–400)
RBC: 4.26 MIL/uL (ref 3.87–5.11)
RDW: 12.6 % (ref 11.5–15.5)
WBC: 7.9 10*3/uL (ref 4.0–10.5)
nRBC: 0 % (ref 0.0–0.2)

## 2022-02-21 LAB — RAPID HIV SCREEN (HIV 1/2 AB+AG)
HIV 1/2 Antibodies: NONREACTIVE
HIV-1 P24 Antigen - HIV24: NONREACTIVE

## 2022-02-21 MED ORDER — PHENYLEPHRINE 80 MCG/ML (10ML) SYRINGE FOR IV PUSH (FOR BLOOD PRESSURE SUPPORT): 80.0000 ug | PREFILLED_SYRINGE | INTRAVENOUS | Status: DC | PRN

## 2022-02-21 MED ORDER — FENTANYL-BUPIVACAINE-NACL 0.5-0.125-0.9 MG/250ML-% EP SOLN: 12.0000 mL/h | EPIDURAL | Status: DC | PRN

## 2022-02-21 MED ORDER — TERBUTALINE SULFATE 1 MG/ML IJ SOLN
0.2500 mg | Freq: Once | INTRAMUSCULAR | Status: AC
  Administered 2022-02-21: 0.25 mg via SUBCUTANEOUS

## 2022-02-21 MED ORDER — LIDOCAINE HCL (PF) 1 % IJ SOLN: 30.0000 mL | INTRAMUSCULAR | Status: DC | PRN

## 2022-02-21 MED ORDER — OXYCODONE-ACETAMINOPHEN 5-325 MG PO TABS: 2.0000 | ORAL_TABLET | ORAL | Status: DC | PRN

## 2022-02-21 MED ORDER — OXYTOCIN BOLUS FROM INFUSION: 333.0000 mL | Freq: Once | INTRAVENOUS | Status: DC

## 2022-02-21 MED ORDER — SOD CITRATE-CITRIC ACID 500-334 MG/5ML PO SOLN
30.0000 mL | ORAL | Status: DC | PRN
  Administered 2022-02-23: 30 mL via ORAL
  Filled 2022-02-21: qty 30

## 2022-02-21 MED ORDER — LACTATED RINGERS IV SOLN: INTRAVENOUS | Status: DC

## 2022-02-21 MED ORDER — OXYCODONE-ACETAMINOPHEN 5-325 MG PO TABS: 1.0000 | ORAL_TABLET | ORAL | Status: DC | PRN

## 2022-02-21 MED ORDER — ACETAMINOPHEN 325 MG PO TABS: 650.0000 mg | ORAL_TABLET | ORAL | Status: DC | PRN

## 2022-02-21 MED ORDER — ONDANSETRON HCL 4 MG/2ML IJ SOLN
4.0000 mg | Freq: Four times a day (QID) | INTRAMUSCULAR | Status: DC | PRN
  Administered 2022-02-23: 4 mg via INTRAVENOUS
  Filled 2022-02-21: qty 2

## 2022-02-21 MED ORDER — EPHEDRINE 5 MG/ML INJ: 10.0000 mg | INTRAVENOUS | Status: DC | PRN

## 2022-02-21 MED ORDER — OXYTOCIN-SODIUM CHLORIDE 30-0.9 UT/500ML-% IV SOLN: 2.5000 [IU]/h | INTRAVENOUS | Status: DC

## 2022-02-21 MED ORDER — MISOPROSTOL 25 MCG QUARTER TABLET: 25.0000 ug | ORAL_TABLET | Freq: Once | ORAL | Status: DC

## 2022-02-21 MED ORDER — ZOLPIDEM TARTRATE 5 MG PO TABS
5.0000 mg | ORAL_TABLET | Freq: Every evening | ORAL | Status: DC | PRN
  Administered 2022-02-22: 5 mg via ORAL
  Filled 2022-02-21: qty 1

## 2022-02-21 MED ORDER — TERBUTALINE SULFATE 1 MG/ML IJ SOLN
0.2500 mg | Freq: Once | INTRAMUSCULAR | Status: DC | PRN
  Filled 2022-02-21: qty 1

## 2022-02-21 MED ORDER — LACTATED RINGERS IV SOLN: 500.0000 mL | Freq: Once | INTRAVENOUS | Status: DC

## 2022-02-21 MED ORDER — TERBUTALINE SULFATE 1 MG/ML IJ SOLN
INTRAMUSCULAR | Status: AC
  Filled 2022-02-21: qty 1

## 2022-02-21 MED ORDER — MISOPROSTOL 50MCG HALF TABLET
50.0000 ug | ORAL_TABLET | Freq: Once | ORAL | Status: AC
  Administered 2022-02-21: 50 ug via ORAL
  Filled 2022-02-21: qty 1

## 2022-02-21 MED ORDER — DIPHENHYDRAMINE HCL 50 MG/ML IJ SOLN: 12.5000 mg | INTRAMUSCULAR | Status: DC | PRN

## 2022-02-21 MED ORDER — FENTANYL CITRATE (PF) 100 MCG/2ML IJ SOLN
50.0000 ug | INTRAMUSCULAR | Status: DC | PRN
  Administered 2022-02-22: 100 ug via INTRAVENOUS
  Administered 2022-02-22: 50 ug via INTRAVENOUS
  Administered 2022-02-23: 100 ug via INTRAVENOUS
  Filled 2022-02-21 (×3): qty 2

## 2022-02-21 MED ORDER — LACTATED RINGERS IV SOLN
500.0000 mL | INTRAVENOUS | Status: DC | PRN
  Administered 2022-02-23: 500 mL via INTRAVENOUS

## 2022-02-21 NOTE — Progress Notes (Signed)
Scanned for presentation 2/2 ECV today and IOL.  Fetus is cephalic yet asynclitic.   Lavonda Jumbo, DO OB Fellow, Faculty [facility], Center for [facility] 02/21/2022, 5:59 PM

## 2022-02-21 NOTE — Progress Notes (Signed)
Autumn Stephenson is a 31 y/o G2P0010 at [redacted]w[redacted]d admitted for IOL for ECV.   Subjective: Uncomfortable with contractions and foley bulb  Objective BP (!) 144/89   Pulse 63   Temp 98 F (36.7 C) (Oral)   Resp 16   LMP 04/29/2021   SpO2 100%    FWB: FHR: 140bpm, moderate variability, accelerations : present, decelerations; absent  UC: irregular 2-3 mins  FB still in place  Assessment/Plan  Contracting every 2-3 mins, cytotec not appropriate at this time.  will start pitocin in indicated after FB out.  Pain control: wants epidural eventually, will try nitrous or fentanyl first Will check for presentation when FB is out.  BPs have been elevated, pt feels like she was in pain every time it was recorded.  Will continue to watch BPs  Milderd Meager, Student-MidWife

## 2022-02-21 NOTE — Progress Notes (Signed)
Scanned for presentation 2/2 ECV today and IOL.  Fetus is cephalic yet asynclitic.   Lavonda Jumbo, DO OB Fellow, Faculty [facility], Center for [facility] 02/21/2022, 3:56 PM

## 2022-02-21 NOTE — Progress Notes (Addendum)
Labor Progress Note Autumn Stephenson is a 31 y.o. G2P0010 at [redacted]w[redacted]d presented for IOL following ECV.   S: Doing well, starting to feel some discomfort with contractions.   O:  BP (!) 130/90   Pulse 61   Temp 98.3 F (36.8 C) (Oral)   Resp 16   LMP 04/29/2021   SpO2 100%  EFM: scatter tracing 2/2 + FM   130bpm/moderate/+accels, no decels  CVE: Dilation: 1 Effacement (%): Thick Cervical Position: Posterior Station: Ballotable Presentation: Vertex Exam by:: Orson Gear   A&P: 31 y.o. G2P0010 [redacted]w[redacted]d presenting for IOL following ECV.  #Labor: FB placed. Contracting every minute. Hold off on pitocin for now. Consider starting if contractions space out or when FB out if appropriate #Pain: Maternally supported, planning for epidural #FWB: Cat I #GBS negative  ECV (2nd attempt) Cephalic presentation.  -scan for presentation q1h until engaged in pelvis.   Angelise Petrich Autry-Lott, DO 6:42 PM

## 2022-02-21 NOTE — Progress Notes (Signed)
   External Cephalic Version  Preprocedure Diagnosis:  31 y.o. G2 P0010 at [redacted]w[redacted]d weeks gestational age with transverse back down with head at maternal left presentation  Post-procedure Diagnosis: 31 y.o. G2 P0010 at [redacted]w[redacted]d weeks gestational age with transverse back down with head at maternal left presentation now vertex  Procedure:  Successful external cephalic version  Procedure in detail:   The patient was brought to Labor and Delivery where a reactive fetal heart tracing was obtained. The patient was noted to have irregular contractions. She was given 1 dose of subcutaneous terbutaline which resolved her contraction. A bedside ultrasound was performed which revealed single intrauterine pregnancy and transverse back down with head at maternal left  presentation. There was noted to be adequate fluid. Using manual pressure, the breech was manipulated in a backward roll fashion until a vertex presentation was obtained. Fetal heart tones were checked intermittently during the procedure and were noted to be reassuring. Following successful external cephalic version, the patient was placed on continuous external fetal monitoring. Abdominal binder placed and cervix was checked and 1/thick/posterior.  Pt moved to labor and delivery for induction.  Will check presentation frequently to ensure vertex presentation.   Elsie Lincoln, MD 02/21/22 9:29 AM

## 2022-02-21 NOTE — H&P (Signed)
Obstetric Pre-procedure History and Physical  Pat Elicker Lagasse is a 31 y.o. G2P0010 with IUP at 69w2dpresenting for ECV and IOL.  Reports good fetal movement, no bleeding, no contractions, no leaking of fluid.  No acute preoperative concerns.  Had ECV on 02/06/22, initially successful, however baby now transverse, fetal head to maternal left side and spine down.   Dating: By [place]--->  Estimated Date of Delivery: 02/26/22   Sono:   '@[redacted]w[redacted]d' , CWD, normal anatomy, transverse (head to maternal right) presentation, 2653g, 61% EFW  [facility] Pregnancy complications or risks: Patient Active Problem List   Diagnosis Date Noted   Fetal malpresentation 02/06/2022   Supervision of high risk pregnancy, antepartum 09/28/2021   ADHD (attention deficit hyperactivity disorder) 09/28/2021   HCV antibody positive 09/28/2021   Hypermobility syndrome 06/07/2021   Strain of calf muscle 06/07/2021   BRCA gene mutation negative 06/08/2019   Insomnia 06/08/2019   Nursing Staff Provider  [place] [facility][x]'  Traditional '[ ]'  Centering '[ ]'  Mom-Baby Dyad    Language  English Anatomy [place] Echogenic focus and low-lying placenta previa, otherwise normal.  Previa RESOLVED  Flu Vaccine   Genetic/Carrier Screen  NIPS:  Low risk (DNWTK sex) AFP: Deferred due to concern for cost  Horizon: Neg x 4   TDaP Vaccine  12/19/21 Hgb A1C or  GTT Early 5.2 Third trimester Nml  COVID Vaccine    LAB RESULTS   Rhogam  NA Blood Type   A+  Baby Feeding Plan Breast Antibody  Neg  Contraception OCPs Rubella  Immune  Circumcision Yes if female RPR   NR  Pediatrician  [facility] HBsAg   Neg  Support Person Mike (husband/FOB) HCVAb Pos antibody, Hep C Quant > not detected, repeat in 1 month  Prenatal Classes  HIV     Neg  BTL Consent NA GBS   Neg  VBAC Consent NA Pap Conflicting results. Per [facility] chart Nml 2021. Per CareEverywhere note ASCUS - HPV  03/19/19 '[ ]' Offer Pap PP          DME Rx '[x]'  BP cuff '[ ]'  Weight Scale Waterbirth  '[ ]'  Class '[ ]'  Consent '[ ]'  CNM visit  PHQ9 & GAD7 [  ] new OB [  ] 28 weeks  [  ] 36 weeks Induction  '[ ]'  Orders Entered '[ ]' Foley Y/N    She plans to breastfeed She desires oral contraceptives (estrogen/progesterone) for postpartum contraception.   Prenatal labs and studies: ABO, Rh: --/--/A POS (08/23 0901) Antibody: NEG (08/23 0901) Rubella: Immune (01/18 0000) RPR: Non Reactive (06/22 0937)  HBsAg: Negative (01/18 0000)  HIV: Non Reactive (06/22 0937)  GYSA:YTKZSWFU/- (08/16 1134) Glucola  nml Genetic screening normal Anatomy [place]normal with EIF  Prenatal Transfer Tool  Maternal Diabetes: No Genetic Screening: Abnormal:  Results: Other: Maternal Ultrasounds/Referrals: Isolated EIF (echogenic intracardiac focus) Fetal Ultrasounds or other Referrals:  None Maternal Substance Abuse:  No Significant Maternal Medications:  None Significant Maternal Lab Results: Group B Strep negative  Past Medical History:  Diagnosis Date   Asthma    Complication of anesthesia    epidural made her nauseated   Syncope     Past Surgical History:  Procedure Laterality Date   DILATION AND EVACUATION N/A 09/12/2020   Procedure: DILATATION AND EVACUATION;  Surgeon: KWaymon Amato MD;  [place]  Service: Gynecology;  Laterality: N/A;  ORIF CLAVICULAR FRACTURE     TONSILLECTOMY  06/17/1997    OB History  Gravida Para Term Preterm AB Living  2       1 0  SAB IAB Ectopic Multiple Live Births  1       0    # Outcome Date GA Lbr Len/2nd Weight Sex Delivery Anes PTL Lv  2 Current           1 SAB             Social History   Socioeconomic History   Marital status: Married    Spouse name: Not on file   Number of children: Not on file   Years of education: Not on file   Highest education level: Not on file  Occupational History   Not on file  Tobacco Use   Smoking status: Former    Packs/day:  0.50    Years: 10.00    Total pack years: 5.00    Types: Cigarettes    Quit date: 03/06/2021    Years since quitting: 0.9   Smokeless tobacco: Never  Vaping Use   Vaping Use: Never used  Substance and Sexual Activity   Alcohol use: Yes    Alcohol/week: 6.0 standard drinks of alcohol    Types: 3 Glasses of wine, 3 Cans of beer per week   Drug use: Never   Sexual activity: Yes  Other Topics Concern   Not on file  Social History Narrative   Not on file   Social Determinants of Health   Financial Resource Strain: Not on file  Food Insecurity: No Food Insecurity (02/21/2022)   Hunger Vital Sign    Worried About Running Out of Food in the Last Year: Never true    Ran Out of Food in the Last Year: Never true  Transportation Needs: No Transportation Needs (02/21/2022)   PRAPARE - Hydrologist (Medical): No    Lack of Transportation (Non-Medical): No  Physical Activity: Not on file  Stress: Not on file  Social Connections: Not on file    Family History  Problem Relation Age of Onset   Cancer Mother    Diabetes Father    Cancer Maternal Grandmother    Alzheimer's disease Paternal Grandmother     Medications Prior to Admission  Medication Sig Dispense Refill Last Dose   Magnesium 200 MG TABS Take 1 tablet by mouth 2 (two) times daily.   02/20/2022   Magnesium Oxide -Mg Supplement (MAG-OXIDE) 200 MG TABS Take 2 tablets (400 mg total) by mouth at bedtime. If that amount causes loose stools in the am, switch to 259m daily at bedtime. 60 tablet 3 02/20/2022   pantoprazole (PROTONIX) 40 MG tablet Take 1 tablet (40 mg total) by mouth daily. 30 tablet 0 02/20/2022   Prenatal Vit-Fe Fumarate-FA (PRENATAL MULTIVITAMIN) TABS tablet Take 1 tablet by mouth daily at 12 noon.   02/20/2022   QUEtiapine (SEROQUEL) 100 MG tablet Take 100 mg by mouth at bedtime.   02/20/2022   VYVANSE 20 MG capsule Take 20 mg by mouth daily.   02/20/2022    No Known Allergies  Review of  Systems: Pertinent items noted in HPI and remainder of comprehensive ROS otherwise negative.  Physical Exam: LMP 04/29/2021  FHR by Doppler: 141 bpm 141 bpm/Moderate variability/ 15x15 accels/ None decels  CONSTITUTIONAL: Well-developed, well-nourished female in no acute distress.  HENT:  Normocephalic, atraumatic, External right and left ear normal. Oropharynx is  clear and moist EYES: Conjunctivae and EOM are normal. Pupils are equal, round, and reactive to light. No scleral icterus.  NECK: Normal range of motion, supple, no masses SKIN: Skin is warm and dry. No rash noted. Not diaphoretic. No erythema. No pallor. NEUROLOGIC: Alert and oriented to person, place, and time. Normal reflexes, muscle tone coordination. No cranial nerve deficit noted. PSYCHIATRIC: Normal mood and affect. Normal behavior. Normal judgment and thought content. CARDIOVASCULAR: Normal heart rate noted, regular rhythm RESPIRATORY: Effort and breath sounds normal, no problems with respiration noted ABDOMEN: Soft, nontender, nondistended, gravid.  PELVIC: Deferred MUSCULOSKELETAL: Normal range of motion. No edema and no tenderness. 2+ distal pulses.  Pertinent Labs/Studies:   No results found for this or any previous visit (from the past 72 hour(s)).  Assessment and Plan: ENID MAULTSBY is a 31 y.o. G2P0010 at 3w2dadmitted for scheduled External cephalic version. The risks of the procedure were discussed with the patient including but were not limited to: failure to turn the baby to head down; pressure on uterus resulting in the water breaking or abruption of the placenta, either of which may result in an emergent cesarean section.   The patient concurred with the proposed plan, giving informed written consent for the procedure. Patient has been NPO since last night she will remain NPO for procedure.   #Labor:Will determine method of delivery once ECV complete. Pt is scheduled for IOL today. #Pain: IV pain meds,  epidural upon req #FWB: CAT 1 #ID:  GBS neg #MOF: Breast #MOC: OCPs #Circ:  Yes if female  JShelda Pal DOdessaFellow, Faculty practice [place]09/07/23  8:10 AM

## 2022-02-21 NOTE — Progress Notes (Signed)
Labor Progress Note Autumn Stephenson is a 31 y.o. G2P0010 at [redacted]w[redacted]d presented for IOL following ECV.   S: Doing well, no concerns.   O:  BP 133/86   Pulse 71   Temp 98.3 F (36.8 C) (Oral)   Resp 16   LMP 04/29/2021   SpO2 100%  EFM: scatter tracing 2/2 + FM   140bpm/moderate/+accels, no decels  CVE: Dilation: 1 Effacement (%): Thick Cervical Position: Posterior Station: Ballotable Presentation: Vertex Exam by:: Orson Gear   A&P: 31 y.o. G2P0010 [redacted]w[redacted]d presenting for IOL following ECV.  #Labor: FB attempted, not successful. Patient wants to avoid dual cytotec. Oral 50 now. Re-attempt FB at next cervical exam.  #Pain: Maternally supported, planning for epidural #FWB: Cat I #GBS negative  ECV (2nd attempt) Cephalic presentation.  -scan for presentation q1h until engaged in pelvis.   [name], DO 1:48 PM

## 2022-02-22 ENCOUNTER — Encounter (HOSPITAL_COMMUNITY): Payer: Self-pay | Admitting: Family Medicine

## 2022-02-22 LAB — CBC
HCT: 38.1 % (ref 36.0–46.0)
Hemoglobin: 13.2 g/dL (ref 12.0–15.0)
MCH: 30.3 pg (ref 26.0–34.0)
MCHC: 34.6 g/dL (ref 30.0–36.0)
MCV: 87.4 fL (ref 80.0–100.0)
Platelets: 168 10*3/uL (ref 150–400)
RBC: 4.36 MIL/uL (ref 3.87–5.11)
RDW: 12.6 % (ref 11.5–15.5)
WBC: 9.9 10*3/uL (ref 4.0–10.5)
nRBC: 0 % (ref 0.0–0.2)

## 2022-02-22 MED ORDER — OXYTOCIN-SODIUM CHLORIDE 30-0.9 UT/500ML-% IV SOLN
1.0000 m[IU]/min | INTRAVENOUS | Status: DC
  Administered 2022-02-22: 1 m[IU]/min via INTRAVENOUS
  Filled 2022-02-22: qty 500

## 2022-02-22 MED ORDER — OXYTOCIN-SODIUM CHLORIDE 30-0.9 UT/500ML-% IV SOLN
1.0000 m[IU]/min | INTRAVENOUS | Status: DC
  Administered 2022-02-22: 2 m[IU]/min via INTRAVENOUS
  Filled 2022-02-22: qty 500

## 2022-02-22 MED ORDER — MISOPROSTOL 25 MCG QUARTER TABLET
25.0000 ug | ORAL_TABLET | ORAL | Status: DC
  Administered 2022-02-22: 25 ug via VAGINAL
  Filled 2022-02-22 (×3): qty 1

## 2022-02-22 MED ORDER — OXYTOCIN-SODIUM CHLORIDE 30-0.9 UT/500ML-% IV SOLN: 1.0000 m[IU]/min | INTRAVENOUS | Status: DC

## 2022-02-22 MED ORDER — TERBUTALINE SULFATE 1 MG/ML IJ SOLN
0.2500 mg | Freq: Once | INTRAMUSCULAR | Status: AC | PRN
  Administered 2022-02-23: 0.25 mg via SUBCUTANEOUS

## 2022-02-22 MED ORDER — MISOPROSTOL 50MCG HALF TABLET
50.0000 ug | ORAL_TABLET | ORAL | Status: DC
  Administered 2022-02-22 (×2): 50 ug via BUCCAL
  Filled 2022-02-22 (×2): qty 1

## 2022-02-22 NOTE — Progress Notes (Signed)
Patient Vitals for the past 4 hrs:  BP Pulse  02/22/22 0115 136/81 (!) 57  02/22/22 0026 123/82 63   FHR Cat 1.  Ctx q 1-2 minutes, very mild per pt. Cx 4/50/out of pelvis.  Presentation confirmed by [place] to be vtx, asynclitic. Dr. Despina Hidden in to examine pt.  Not an AROM candidate, to start pitocin at 1 mu/min.  Marland Kitchen

## 2022-02-22 NOTE — Progress Notes (Addendum)
Patient ID: Autumn Stephenson, female   DOB: August 19, 1990, 31 y.o.   MRN: [id number]   Autumn Stephenson is a 31 y.o. G2P0010 at [redacted]w[redacted]d  admitted for IOL  Subjective:  -patient doing well; feels like her contractions are spacing out  Objective: Vitals:   02/22/22 0026 02/22/22 0115 02/22/22 0530 02/22/22 0818  BP: 123/82 136/81 115/70 (!) 142/91  Pulse: 63 (!) 57 (!) 56 69  Resp:   17 18  Temp:   98.3 F (36.8 C) 98.1 F (36.7 C)  TempSrc:   Oral Oral  SpO2:       No intake/output data recorded.  FHT:  FHR: 140 bpm, variability: moderate,  accelerations:  Present,  decelerations:  Absent UC:   irregular, every 2-3 minutes SVE:   Dilation: 3.5 Effacement (%): Thick Station: -3 Exam by:: Autumn Stephenson,cnm Pitocin @ 2 mu/min  Labs: Lab Results  Component Value Date   WBC 7.9 02/21/2022   HGB 13.0 02/21/2022   HCT 37.2 02/21/2022   MCV 87.3 02/21/2022   PLT 150 02/21/2022    Assessment / Plan: Protracted latent phase -will stop pitocin and give buccal cytotec -[place] confirms that patient is vertex  Labor:  still in early labor Fetal Wellbeing:  Category I Pain Control:  Labor support without medications Anticipated MOD:  NSVD  Charlesetta Garibaldi Saman Giddens 02/22/2022, 11:10 AM

## 2022-02-22 NOTE — Progress Notes (Signed)
BP 136/81   Pulse (!) 57   Temp 98 F (36.7 C) (Oral)   Resp 16   LMP 04/29/2021   SpO2 100%    FB out 0018 SVE; 4/50/-3 Vertex  FHR Cat 1 UC 1-2 mins, mild  Expectant  management for now. Augment with pitocin if contractions space out or no cervical change.  Milderd Meager, Student-MidWife

## 2022-02-22 NOTE — Progress Notes (Signed)
Labor Progress Note Autumn Stephenson is a 31 y.o. G2P0010 at [redacted]w[redacted]d presented for IOL.   S: Doing well, no concerns at this time.   O:  BP 108/66   Pulse (!) 51   Temp 98.1 F (36.7 C) (Oral)   Resp 18   LMP 04/29/2021   SpO2 100%  EFM: 125bpm/moderate/+accels, no decels  CVE: Dilation: 3.5 Effacement (%): Thick Cervical Position: Posterior Station: -3 Presentation: Vertex (confirmed by ultrasound) Exam by:: kooistra,cnm   A&P: 31 y.o. G2P0010 [redacted]w[redacted]d here for IOL.   #Labor: Continues to have a thick cervix that may benefit from further ripening. Recent buccal cytotec. Next due around 230pm; give vaginal cytotec.  #Pain: Maternally supported #FWB: Cat I #GBS negative   Gerarda Conklin Autry-Lott, DO 2:01 PM

## 2022-02-22 NOTE — Progress Notes (Signed)
Labor Progress Note  Autumn Stephenson is a 31 y.o. G2P0010 at [redacted]w[redacted]d presented for IOL.   S: Doing well, no concerns at this time.   O:  BP 130/74   Pulse (!) 55   Temp 98.3 F (36.8 C) (Oral)   Resp 18   LMP 04/29/2021   SpO2 100%  EFM: 135bpm/moderate/+accels, no decels  CVE: Dilation: 4 Effacement (%): Thick Cervical Position: Posterior Station: -3 Presentation: Vertex Exam by:: Dr. Salvadore Dom  A&P: 31 y.o. G2P0010 [redacted]w[redacted]d here for IOL.   #Labor: Continues to require cervical ripening. 50 mg oral cytotec now and Cook's catheter placed. Not likely to be in place long considering this is the second catheter. Consider starting pitocin when appropriate.  #Pain: Maternally supported #FWB: Cat I #GBS negative  Autumn Ligman Autry-Lott, DO 7:13 PM

## 2022-02-22 NOTE — Progress Notes (Signed)
Autumn Stephenson is a 31 y.o. G2P0010 at [redacted]w[redacted]d by ultrasound admitted for induction of labor due to ECV #2 at term/elective.  Subjective: Pt comfortable, not feeling contractions, husband in room for support.   Objective: BP 122/76   Pulse 64   Temp 98.2 F (36.8 C) (Oral)   Resp 16   LMP 04/29/2021   SpO2 100%  No intake/output data recorded. No intake/output data recorded.  FHT:  FHR: 135 bpm, variability: moderate,  accelerations:  Present,  decelerations:  Absent UC:   irritability SVE:   Dilation: 4 Effacement (%): Thick Station: -3 Exam by:: Dr. Salvadore Dom  Labs: Lab Results  Component Value Date   WBC 7.9 02/21/2022   HGB 13.0 02/21/2022   HCT 37.2 02/21/2022   MCV 87.3 02/21/2022   PLT 150 02/21/2022    Assessment / Plan: Induction of labor due to ECV # 2/elective  Labor:  Foley balloon in place x 3+ hours, RN to put additional traction, will recheck in 2-3 hours. If no cervical change and FB still in cervix, consider restarting low dose Pitocin when appropriate.  Preeclampsia:   n/a Fetal Wellbeing:  Category I Pain Control:  Labor support without medications I/D:   GBS neg Anticipated MOD:  NSVD  Sharen Counter, CNM 02/22/2022, 9:16 PM

## 2022-02-23 ENCOUNTER — Inpatient Hospital Stay (HOSPITAL_COMMUNITY): Payer: 59 | Admitting: Anesthesiology

## 2022-02-23 ENCOUNTER — Other Ambulatory Visit: Payer: Self-pay

## 2022-02-23 ENCOUNTER — Encounter (HOSPITAL_COMMUNITY): Payer: Self-pay | Admitting: Anesthesiology

## 2022-02-23 ENCOUNTER — Encounter (HOSPITAL_COMMUNITY)
Admission: AD | Disposition: A | Discharge status: Home / Self Care | Payer: Self-pay | Source: Home / Self Care | Attending: Obstetrics & Gynecology

## 2022-02-23 ENCOUNTER — Encounter (HOSPITAL_COMMUNITY): Payer: Self-pay | Admitting: Family Medicine

## 2022-02-23 DIAGNOSIS — O321XX1 Maternal care for breech presentation, fetus 1: Secondary | ICD-10-CM

## 2022-02-23 DIAGNOSIS — O43213 Placenta accreta, third trimester: Secondary | ICD-10-CM

## 2022-02-23 DIAGNOSIS — Z3A39 39 weeks gestation of pregnancy: Secondary | ICD-10-CM

## 2022-02-23 LAB — BPAM RBC
Blood Product Expiration Date: 202310052359
Blood Product Expiration Date: 202310052359
Unit Type and Rh: 6200
Unit Type and Rh: 6200

## 2022-02-23 LAB — TYPE AND SCREEN
ABO/RH(D): A POS
Antibody Screen: NEGATIVE
Unit division: 0
Unit division: 0

## 2022-02-23 LAB — CBC
HCT: 37.8 % (ref 36.0–46.0)
Hemoglobin: 13 g/dL (ref 12.0–15.0)
MCH: 30 pg (ref 26.0–34.0)
MCHC: 34.4 g/dL (ref 30.0–36.0)
MCV: 87.3 fL (ref 80.0–100.0)
Platelets: 135 10*3/uL — ABNORMAL LOW (ref 150–400)
RBC: 4.33 MIL/uL (ref 3.87–5.11)
RDW: 12.4 % (ref 11.5–15.5)
WBC: 10.7 10*3/uL — ABNORMAL HIGH (ref 4.0–10.5)
nRBC: 0 % (ref 0.0–0.2)

## 2022-02-23 SURGERY — Surgical Case
Anesthesia: Spinal

## 2022-02-23 MED ORDER — ONDANSETRON HCL 4 MG/2ML IJ SOLN
INTRAMUSCULAR | Status: DC | PRN
  Administered 2022-02-23: 4 mg via INTRAVENOUS

## 2022-02-23 MED ORDER — PHENYLEPHRINE HCL-NACL 20-0.9 MG/250ML-% IV SOLN
INTRAVENOUS | Status: DC | PRN
  Administered 2022-02-23: 60 ug/min via INTRAVENOUS

## 2022-02-23 MED ORDER — NALOXONE HCL 4 MG/10ML IJ SOLN: 1.0000 ug/kg/h | INTRAVENOUS | Status: DC | PRN

## 2022-02-23 MED ORDER — SODIUM CHLORIDE 0.9% FLUSH: 3.0000 mL | INTRAVENOUS | Status: DC | PRN

## 2022-02-23 MED ORDER — SOD CITRATE-CITRIC ACID 500-334 MG/5ML PO SOLN: 30.0000 mL | Freq: Once | ORAL | Status: DC

## 2022-02-23 MED ORDER — LACTATED RINGERS IV BOLUS: 1000.0000 mL | Freq: Once | INTRAVENOUS | Status: DC

## 2022-02-23 MED ORDER — OXYCODONE HCL 5 MG PO TABS
5.0000 mg | ORAL_TABLET | ORAL | Status: DC | PRN
  Administered 2022-02-24 – 2022-02-26 (×6): 10 mg via ORAL
  Filled 2022-02-23 (×6): qty 2

## 2022-02-23 MED ORDER — MORPHINE SULFATE (PF) 0.5 MG/ML IJ SOLN
INTRAMUSCULAR | Status: AC
  Filled 2022-02-23: qty 10

## 2022-02-23 MED ORDER — DEXMEDETOMIDINE (PRECEDEX) IN NS 20 MCG/5ML (4 MCG/ML) IV SYRINGE
PREFILLED_SYRINGE | INTRAVENOUS | Status: DC | PRN
  Administered 2022-02-23: 4 ug via INTRAVENOUS

## 2022-02-23 MED ORDER — MISOPROSTOL 200 MCG PO TABS
800.0000 ug | ORAL_TABLET | Freq: Once | ORAL | Status: AC
  Administered 2022-02-23: 800 ug via RECTAL

## 2022-02-23 MED ORDER — SENNOSIDES-DOCUSATE SODIUM 8.6-50 MG PO TABS
2.0000 | ORAL_TABLET | Freq: Every day | ORAL | Status: DC
  Administered 2022-02-24 – 2022-02-26 (×3): 2 via ORAL
  Filled 2022-02-23 (×3): qty 2

## 2022-02-23 MED ORDER — DEXAMETHASONE SODIUM PHOSPHATE 4 MG/ML IJ SOLN
INTRAMUSCULAR | Status: DC | PRN
  Administered 2022-02-23: 4 mg via INTRAVENOUS

## 2022-02-23 MED ORDER — FENTANYL CITRATE (PF) 100 MCG/2ML IJ SOLN: 25.0000 ug | INTRAMUSCULAR | Status: DC | PRN

## 2022-02-23 MED ORDER — DEXMEDETOMIDINE HCL IN NACL 80 MCG/20ML IV SOLN
INTRAVENOUS | Status: AC
  Filled 2022-02-23: qty 20

## 2022-02-23 MED ORDER — BUPIVACAINE IN DEXTROSE 0.75-8.25 % IT SOLN
INTRATHECAL | Status: DC | PRN
  Administered 2022-02-23: 1.6 mL via INTRATHECAL

## 2022-02-23 MED ORDER — ONDANSETRON HCL 4 MG/2ML IJ SOLN
INTRAMUSCULAR | Status: AC
  Filled 2022-02-23: qty 2

## 2022-02-23 MED ORDER — COCONUT OIL OIL
1.0000 | TOPICAL_OIL | Status: DC | PRN
Start: 2022-02-23 — End: 2022-02-26

## 2022-02-23 MED ORDER — ACETAMINOPHEN 10 MG/ML IV SOLN
INTRAVENOUS | Status: DC | PRN
  Administered 2022-02-23: 1000 mg via INTRAVENOUS

## 2022-02-23 MED ORDER — PHENYLEPHRINE HCL-NACL 20-0.9 MG/250ML-% IV SOLN
INTRAVENOUS | Status: AC
  Filled 2022-02-23: qty 250

## 2022-02-23 MED ORDER — CEFAZOLIN SODIUM-DEXTROSE 2-4 GM/100ML-% IV SOLN
2.0000 g | INTRAVENOUS | Status: AC
  Administered 2022-02-23: 2 g via INTRAVENOUS

## 2022-02-23 MED ORDER — OXYTOCIN-SODIUM CHLORIDE 30-0.9 UT/500ML-% IV SOLN
INTRAVENOUS | Status: AC
  Filled 2022-02-23: qty 500

## 2022-02-23 MED ORDER — ACETAMINOPHEN 325 MG PO TABS
650.0000 mg | ORAL_TABLET | ORAL | Status: DC | PRN
  Administered 2022-02-23 – 2022-02-26 (×3): 650 mg via ORAL
  Filled 2022-02-23 (×3): qty 2

## 2022-02-23 MED ORDER — STERILE WATER FOR IRRIGATION IR SOLN
Status: DC | PRN
  Administered 2022-02-23: 1

## 2022-02-23 MED ORDER — METHYLERGONOVINE MALEATE 0.2 MG/ML IJ SOLN: 0.2000 mg | Freq: Four times a day (QID) | INTRAMUSCULAR | Status: AC

## 2022-02-23 MED ORDER — OXYTOCIN-SODIUM CHLORIDE 30-0.9 UT/500ML-% IV SOLN
2.5000 [IU]/h | INTRAVENOUS | Status: AC
  Administered 2022-02-23: 2.5 [IU]/h via INTRAVENOUS
  Filled 2022-02-23: qty 500

## 2022-02-23 MED ORDER — PRENATAL MULTIVITAMIN CH
1.0000 | ORAL_TABLET | Freq: Every day | ORAL | Status: DC
  Administered 2022-02-24 – 2022-02-26 (×3): 1 via ORAL
  Filled 2022-02-23 (×3): qty 1

## 2022-02-23 MED ORDER — ONDANSETRON HCL 4 MG/2ML IJ SOLN: 4.0000 mg | Freq: Once | INTRAMUSCULAR | Status: DC | PRN

## 2022-02-23 MED ORDER — MEASLES, MUMPS & RUBELLA VAC IJ SOLR: 0.5000 mL | Freq: Once | INTRAMUSCULAR | Status: DC

## 2022-02-23 MED ORDER — MISOPROSTOL 200 MCG PO TABS
ORAL_TABLET | ORAL | Status: AC
  Filled 2022-02-23: qty 4

## 2022-02-23 MED ORDER — DIPHENHYDRAMINE HCL 50 MG/ML IJ SOLN: 12.5000 mg | INTRAMUSCULAR | Status: DC | PRN

## 2022-02-23 MED ORDER — ENOXAPARIN SODIUM 40 MG/0.4ML IJ SOSY
40.0000 mg | PREFILLED_SYRINGE | INTRAMUSCULAR | Status: DC
  Administered 2022-02-24 – 2022-02-26 (×3): 40 mg via SUBCUTANEOUS
  Filled 2022-02-23 (×3): qty 0.4

## 2022-02-23 MED ORDER — SIMETHICONE 80 MG PO CHEW
80.0000 mg | CHEWABLE_TABLET | Freq: Three times a day (TID) | ORAL | Status: DC
  Administered 2022-02-23 – 2022-02-26 (×8): 80 mg via ORAL
  Filled 2022-02-23 (×8): qty 1

## 2022-02-23 MED ORDER — OXYTOCIN-SODIUM CHLORIDE 30-0.9 UT/500ML-% IV SOLN
INTRAVENOUS | Status: DC | PRN
  Administered 2022-02-23: 300 mL via INTRAVENOUS

## 2022-02-23 MED ORDER — KETOROLAC TROMETHAMINE 30 MG/ML IJ SOLN
30.0000 mg | Freq: Four times a day (QID) | INTRAMUSCULAR | Status: AC
  Administered 2022-02-24 (×3): 30 mg via INTRAVENOUS
  Filled 2022-02-23 (×4): qty 1

## 2022-02-23 MED ORDER — LACTATED RINGERS IV SOLN: INTRAVENOUS | Status: DC

## 2022-02-23 MED ORDER — MORPHINE SULFATE (PF) 0.5 MG/ML IJ SOLN
INTRAMUSCULAR | Status: DC | PRN
  Administered 2022-02-23: .15 mg via INTRATHECAL

## 2022-02-23 MED ORDER — TETANUS-DIPHTH-ACELL PERTUSSIS 5-2.5-18.5 LF-MCG/0.5 IM SUSY: 0.5000 mL | PREFILLED_SYRINGE | Freq: Once | INTRAMUSCULAR | Status: DC

## 2022-02-23 MED ORDER — IBUPROFEN 600 MG PO TABS
600.0000 mg | ORAL_TABLET | Freq: Four times a day (QID) | ORAL | Status: DC
  Administered 2022-02-24 – 2022-02-26 (×8): 600 mg via ORAL
  Filled 2022-02-23 (×8): qty 1

## 2022-02-23 MED ORDER — SODIUM CHLORIDE 0.9 % IR SOLN
Status: DC | PRN
  Administered 2022-02-23: 1

## 2022-02-23 MED ORDER — SODIUM CHLORIDE 0.9 % IV SOLN
500.0000 mg | INTRAVENOUS | Status: AC
  Administered 2022-02-23: 500 mg via INTRAVENOUS
  Filled 2022-02-23: qty 5

## 2022-02-23 MED ORDER — KETOROLAC TROMETHAMINE 30 MG/ML IJ SOLN
INTRAMUSCULAR | Status: AC
  Filled 2022-02-23: qty 1

## 2022-02-23 MED ORDER — MENTHOL 3 MG MT LOZG: 1.0000 | LOZENGE | OROMUCOSAL | Status: DC | PRN

## 2022-02-23 MED ORDER — DIPHENHYDRAMINE HCL 25 MG PO CAPS: 25.0000 mg | ORAL_CAPSULE | Freq: Four times a day (QID) | ORAL | Status: DC | PRN

## 2022-02-23 MED ORDER — KETOROLAC TROMETHAMINE 30 MG/ML IJ SOLN
30.0000 mg | Freq: Four times a day (QID) | INTRAMUSCULAR | Status: AC | PRN
Start: 2022-02-23 — End: 2022-02-24

## 2022-02-23 MED ORDER — TRANEXAMIC ACID-NACL 1000-0.7 MG/100ML-% IV SOLN
INTRAVENOUS | Status: DC | PRN
  Administered 2022-02-23: 1000 mg via INTRAVENOUS

## 2022-02-23 MED ORDER — WITCH HAZEL-GLYCERIN EX PADS
1.0000 | MEDICATED_PAD | CUTANEOUS | Status: DC | PRN
Start: 2022-02-23 — End: 2022-02-26

## 2022-02-23 MED ORDER — ONDANSETRON HCL 4 MG/2ML IJ SOLN: 4.0000 mg | Freq: Three times a day (TID) | INTRAMUSCULAR | Status: DC | PRN

## 2022-02-23 MED ORDER — SIMETHICONE 80 MG PO CHEW: 80.0000 mg | CHEWABLE_TABLET | ORAL | Status: DC | PRN

## 2022-02-23 MED ORDER — ACETAMINOPHEN 10 MG/ML IV SOLN
INTRAVENOUS | Status: AC
  Filled 2022-02-23: qty 100

## 2022-02-23 MED ORDER — SCOPOLAMINE 1 MG/3DAYS TD PT72
MEDICATED_PATCH | TRANSDERMAL | Status: AC
  Filled 2022-02-23: qty 1

## 2022-02-23 MED ORDER — DIPHENHYDRAMINE HCL 25 MG PO CAPS: 25.0000 mg | ORAL_CAPSULE | ORAL | Status: DC | PRN

## 2022-02-23 MED ORDER — FENTANYL CITRATE (PF) 100 MCG/2ML IJ SOLN
INTRAMUSCULAR | Status: DC | PRN
Start: 2022-02-23 — End: 2022-02-23
  Administered 2022-02-23: 15 ug via INTRATHECAL

## 2022-02-23 MED ORDER — DIBUCAINE (PERIANAL) 1 % EX OINT: 1.0000 | TOPICAL_OINTMENT | CUTANEOUS | Status: DC | PRN

## 2022-02-23 MED ORDER — METHYLERGONOVINE MALEATE 0.2 MG/ML IJ SOLN
INTRAMUSCULAR | Status: DC | PRN
  Administered 2022-02-23: .2 mg via INTRAMUSCULAR

## 2022-02-23 MED ORDER — FENTANYL CITRATE (PF) 100 MCG/2ML IJ SOLN
INTRAMUSCULAR | Status: AC
  Filled 2022-02-23: qty 2

## 2022-02-23 MED ORDER — NALOXONE HCL 0.4 MG/ML IJ SOLN: 0.4000 mg | INTRAMUSCULAR | Status: DC | PRN

## 2022-02-23 MED ORDER — KETOROLAC TROMETHAMINE 30 MG/ML IJ SOLN
30.0000 mg | Freq: Four times a day (QID) | INTRAMUSCULAR | Status: AC | PRN
  Administered 2022-02-23 (×2): 30 mg via INTRAVENOUS

## 2022-02-23 MED ORDER — LISDEXAMFETAMINE DIMESYLATE 20 MG PO CAPS: 20.0000 mg | ORAL_CAPSULE | Freq: Every day | ORAL | Status: DC

## 2022-02-23 MED ORDER — METHYLERGONOVINE MALEATE 0.2 MG PO TABS
0.2000 mg | ORAL_TABLET | Freq: Four times a day (QID) | ORAL | Status: AC
  Administered 2022-02-23 (×2): 0.2 mg via ORAL
  Filled 2022-02-23 (×2): qty 1

## 2022-02-23 MED ORDER — QUETIAPINE FUMARATE 100 MG PO TABS
100.0000 mg | ORAL_TABLET | Freq: Every day | ORAL | Status: DC
  Filled 2022-02-23 (×4): qty 1

## 2022-02-23 MED ORDER — MEDROXYPROGESTERONE ACETATE 150 MG/ML IM SUSP
150.0000 mg | INTRAMUSCULAR | Status: DC | PRN
Start: 2022-02-23 — End: 2022-02-26

## 2022-02-23 MED ORDER — SCOPOLAMINE 1 MG/3DAYS TD PT72
MEDICATED_PATCH | TRANSDERMAL | Status: DC | PRN
  Administered 2022-02-23: 1 via TRANSDERMAL

## 2022-02-23 SURGICAL SUPPLY — 37 items
BENZOIN TINCTURE PRP APPL 2/3 (GAUZE/BANDAGES/DRESSINGS) ×1 IMPLANT
CHLORAPREP W/TINT 26ML (MISCELLANEOUS) ×2 IMPLANT
CLAMP CORD UMBIL (MISCELLANEOUS) ×1 IMPLANT
CLOTH BEACON ORANGE TIMEOUT ST (SAFETY) ×1 IMPLANT
DERMABOND ADVANCED (GAUZE/BANDAGES/DRESSINGS) ×1
DERMABOND ADVANCED .7 DNX12 (GAUZE/BANDAGES/DRESSINGS) ×1 IMPLANT
DRSG OPSITE POSTOP 4X10 (GAUZE/BANDAGES/DRESSINGS) ×1 IMPLANT
ELECT REM PT RETURN 9FT ADLT (ELECTROSURGICAL) ×1
ELECTRODE REM PT RTRN 9FT ADLT (ELECTROSURGICAL) ×1 IMPLANT
EXTRACTOR VACUUM KIWI (MISCELLANEOUS) IMPLANT
GAUZE SPONGE 4X4 12PLY STRL LF (GAUZE/BANDAGES/DRESSINGS) IMPLANT
GLOVE BIOGEL PI IND STRL 7.0 (GLOVE) ×3 IMPLANT
GLOVE ECLIPSE 6.5 STRL STRAW (GLOVE) ×1 IMPLANT
GOWN STRL REUS W/TWL LRG LVL3 (GOWN DISPOSABLE) ×3 IMPLANT
KIT ABG SYR 3ML LUER SLIP (SYRINGE) IMPLANT
NDL HYPO 25X5/8 SAFETYGLIDE (NEEDLE) IMPLANT
NEEDLE HYPO 25X5/8 SAFETYGLIDE (NEEDLE) IMPLANT
NS IRRIG 1000ML POUR BTL (IV SOLUTION) ×1 IMPLANT
PACK C SECTION WH (CUSTOM PROCEDURE TRAY) ×1 IMPLANT
PAD ABD 7.5X8 STRL (GAUZE/BANDAGES/DRESSINGS) ×1 IMPLANT
PAD OB MATERNITY 4.3X12.25 (PERSONAL CARE ITEMS) ×1 IMPLANT
RTRCTR C-SECT PINK 25CM LRG (MISCELLANEOUS) ×1 IMPLANT
STRIP CLOSURE SKIN 1/2X4 (GAUZE/BANDAGES/DRESSINGS) IMPLANT
SUT PLAIN 0 NONE (SUTURE) IMPLANT
SUT PLAIN 2 0 XLH (SUTURE) IMPLANT
SUT VIC AB 0 CT1 27 (SUTURE) ×2
SUT VIC AB 0 CT1 27XBRD ANBCTR (SUTURE) ×2 IMPLANT
SUT VIC AB 0 CTX 36 (SUTURE) ×3
SUT VIC AB 0 CTX36XBRD ANBCTRL (SUTURE) ×3 IMPLANT
SUT VIC AB 2-0 CT1 27 (SUTURE) ×1
SUT VIC AB 2-0 CT1 TAPERPNT 27 (SUTURE) ×1 IMPLANT
SUT VIC AB 3-0 SH 27 (SUTURE) ×1
SUT VIC AB 3-0 SH 27XBRD (SUTURE) ×1 IMPLANT
SUT VIC AB 4-0 KS 27 (SUTURE) ×1 IMPLANT
TOWEL OR 17X24 6PK STRL BLUE (TOWEL DISPOSABLE) ×1 IMPLANT
TRAY FOLEY W/BAG SLVR 14FR LF (SET/KITS/TRAYS/PACK) IMPLANT
WATER STERILE IRR 1000ML POUR (IV SOLUTION) ×1 IMPLANT

## 2022-02-23 NOTE — Anesthesia Postprocedure Evaluation (Signed)
Anesthesia Post Note  Patient: Autumn Stephenson  Procedure(s) Performed: CESAREAN SECTION     Patient location during evaluation: PACU Anesthesia Type: Spinal Level of consciousness: oriented and awake and alert Pain management: pain level controlled Vital Signs Assessment: post-procedure vital signs reviewed and stable Respiratory status: spontaneous breathing, respiratory function stable and nonlabored ventilation Cardiovascular status: blood pressure returned to baseline and stable Postop Assessment: no headache, no backache, no apparent nausea or vomiting, spinal receding and patient able to bend at knees Anesthetic complications: no   No notable events documented.  Last Vitals:  Vitals:   02/23/22 1200 02/23/22 1215  BP: 123/74 129/89  Pulse: (!) 58 (!) 58  Resp: 15 18  Temp:    SpO2: 100% 96%    Last Pain:  Vitals:   02/23/22 1220  TempSrc:   PainSc: 0-No pain   Pain Goal:    LLE Motor Response: Responds to commands (02/23/22 1220) LLE Sensation: Tingling (02/23/22 1220) RLE Motor Response: Responds to commands (02/23/22 1220) RLE Sensation: Tingling (02/23/22 1220) L Sensory Level: T10-Umbilical region (02/23/22 1220) R Sensory Level: T10-Umbilical region (02/23/22 1220) Epidural/Spinal Function Cutaneous sensation: Tingles (02/23/22 1220), Patient able to flex knees: Yes (02/23/22 1220), Patient able to lift hips off bed: No (02/23/22 1220), Back pain beyond tenderness at insertion site: No (02/23/22 1220), Progressively worsening motor and/or sensory loss: No (02/23/22 1220), Bowel and/or bladder incontinence post epidural: No (02/23/22 1220)  Daire Okimoto A.

## 2022-02-23 NOTE — Lactation Note (Signed)
This note was copied from a baby's chart. Lactation Consultation Note  Patient Name: Boy Emmilee Reamer HRCBU'L Date: 02/23/2022 Reason for consult: Initial assessment;Primapara;Term Age:30 hours Baby gagging and spitting up when LC came into rm. LC used bulb syring to clean mouth. Baby had emesis 2 times while in rm. Mom stated baby had spit up earlier. Explained this is the reason why baby has no interest in BF at this time. Hand expressed rusty pipe colostrum from Rt. Breast. Asked mom about Hep.C mom stated one test stated yes but another stated no and MD stated no. Baby took colostrum from spoon w/much stimulation. Newborn feeding habits, behavior, STS, I&O reviewed. Mom encouraged to feed baby 8-12 times/24 hours and with feeding cues.   When baby is cueing asked mom to call for [facility] assistance.  Maternal Data Has patient been taught Hand Expression?: Yes Does the patient have breastfeeding experience prior to this delivery?: No  Feeding    LATCH Score       Type of Nipple: Everted at rest and after stimulation (short shaft)  Comfort (Breast/Nipple): Soft / non-tender         Lactation Tools Discussed/Used Tools: Shells;Pump Breast pump type: Manual Pump Education: Setup, frequency, and cleaning;Milk Storage Reason for Pumping: pre-pumping Pumping frequency: q3h  Interventions Interventions: Breast massage;Hand express;Pre-pump if needed;Breast compression;Support pillows;Position options;Expressed milk;Shells;Hand pump;LC Services brochure  Discharge    Consult Status Consult Status: Follow-up Date: 02/24/22 Follow-up type: In-patient    Charyl Dancer 02/23/2022, 11:38 PM

## 2022-02-23 NOTE — Progress Notes (Signed)
Autumn Stephenson is a 31 y.o. G2P0010 at [redacted]w[redacted]d admitted for induction of labor due to ECV #2 at term.  Subjective: Pt comfortable, feeling mild cramping, able to talk through contractions.    Objective: BP 110/62   Pulse 68   Temp 98.2 F (36.8 C) (Oral)   Resp 18   LMP 04/29/2021   SpO2 100%  No intake/output data recorded. No intake/output data recorded.  FHT:  FHR: 140 bpm, variability: moderate,  accelerations:  Present,  decelerations:  Present prolonged x 1 lasting 3 minutes, down to 120 UC:   regular, every 1-2 minutes SVE:   Dilation: 4 Effacement (%): 50 Station: -3 Exam by:: L. Leftwich-Kirby, CNM  Labs: Lab Results  Component Value Date   WBC 9.9 02/22/2022   HGB 13.2 02/22/2022   HCT 38.1 02/22/2022   MCV 87.4 02/22/2022   PLT 168 02/22/2022    Assessment / Plan: IOL after successful ECV #2  Labor:  Contractions close together but mild.  Discussed AROM with/without IUPC with pt but exam with fetal high station, very posterior cervix, head not well applied.  Increase Pitocin by 2 mu at this time but if contractions remain frequent, consider reducing Pitocin for more contraction spacing.  AROM at next exam if possible. Preeclampsia:   n/a Fetal Wellbeing:  Category II Pain Control:  Labor support without medications I/D:   GBS neg Anticipated MOD:  NSVD  Sharen Counter, CNM 02/23/2022, 1:17 AM

## 2022-02-23 NOTE — Progress Notes (Signed)
Patient seen. Has been on induction for 2 days now, following 2 ECV procedures due to transverse lie. She is s/p 2 foley balloons, 4 doses of misoprostol and pitocin has had to be started and stopped several times, due to tachysystole and FHR decels. She has been 4cm for > 16 hrs, AROM attempted was unsuccessful, as fetal presenting part still high. She is not requesting to go ahead with a C-section which is reasonable, given arrest of dilation.. FHR is Category 1 at this time  The risks of cesarean section discussed with the patient included but were not limited to: bleeding which may require transfusion or reoperation; infection which may require antibiotics; injury to bowel, bladder, ureters or other surrounding organs; injury to the fetus; need for additional procedures including hysterectomy in the event of a life-threatening hemorrhage; placental abnormalities wth subsequent pregnancies, incisional problems, thromboembolic phenomenon and other postoperative/anesthesia complications. The patient concurred with the proposed plan, giving informed written consent for the procedure. Anesthesia and OR aware. Preoperative prophylactic antibiotics and SCDs ordered. on call to the OR. To OR when ready.   Discussed with Dr Charlotta Newton.  Sheppard Evens MD MPH OB Fellow, Faculty Practice [facility], Center for [facility] 02/23/2022

## 2022-02-23 NOTE — Anesthesia Preprocedure Evaluation (Signed)
Anesthesia Evaluation  Patient identified by MRN, date of birth, ID band Patient awake    Reviewed: Allergy & Precautions, NPO status , Patient's Chart, lab work & pertinent test results  History of Anesthesia Complications (+) history of anesthetic complications  Airway Mallampati: II  TM Distance: >3 FB Neck ROM: Full    Dental no notable dental hx. (+) Teeth Intact, Dental Advisory Given   Pulmonary asthma , former smoker,    Pulmonary exam normal breath sounds clear to auscultation       Cardiovascular negative cardio ROS Normal cardiovascular exam Rhythm:Regular Rate:Normal     Neuro/Psych PSYCHIATRIC DISORDERS ADHD Neuromuscular disease    GI/Hepatic Neg liver ROS, GERD  Medicated,  Endo/Other  negative endocrine ROS  Renal/GU negative Renal ROS  negative genitourinary   Musculoskeletal negative musculoskeletal ROS (+)   Abdominal   Peds  Hematology negative hematology ROS (+)   Anesthesia Other Findings   Reproductive/Obstetrics (+) Pregnancy Hx/o malpresentation Breech                             Anesthesia Physical Anesthesia Plan  ASA: 2 and emergent  Anesthesia Plan: Spinal   Post-op Pain Management:    Induction: Intravenous  PONV Risk Score and Plan: 4 or greater and Treatment may vary due to age or medical condition and Scopolamine patch - Pre-op  Airway Management Planned: Natural Airway  Additional Equipment: None  Intra-op Plan:   Post-operative Plan:   Informed Consent: I have reviewed the patients History and Physical, chart, labs and discussed the procedure including the risks, benefits and alternatives for the proposed anesthesia with the patient or authorized representative who has indicated his/her understanding and acceptance.     Dental advisory given  Plan Discussed with: CRNA and Anesthesiologist  Anesthesia Plan Comments:          Anesthesia Quick Evaluation

## 2022-02-23 NOTE — Transfer of Care (Signed)
Immediate Anesthesia Transfer of Care Note  Patient: Autumn Stephenson  Procedure(s) Performed: CESAREAN SECTION  Patient Location: PACU  Anesthesia Type:Spinal  Level of Consciousness: awake, alert  and oriented  Airway & Oxygen Therapy: Patient Spontanous Breathing  Post-op Assessment: Report given to RN and Post -op Vital signs reviewed and stable  Post vital signs: Reviewed and stable  Last Vitals:  Vitals Value Taken Time  BP 115/60 02/23/22 1120  Temp    Pulse 65 02/23/22 1124  Resp 22 02/23/22 1124  SpO2 100 % 02/23/22 1124  Vitals shown include unvalidated device data.  Last Pain:  Vitals:   02/23/22 0730  TempSrc: Oral  PainSc: 0-No pain         Complications: No notable events documented.

## 2022-02-23 NOTE — Progress Notes (Signed)
Laury Huizar Koob is a 31 y.o. G2P0010 at [redacted]w[redacted]d admitted for IOL for ECV #2 at term.  Subjective: Pt feeling mild cramping, occasionally more painful but then resolves.   Objective: BP (!) 124/58   Pulse 95   Temp 98 F (36.7 C) (Oral)   Resp 16   LMP 04/29/2021   SpO2 96%  No intake/output data recorded. No intake/output data recorded.  FHT:  FHR: 140 bpm, variability: moderate,  accelerations:  Present,  decelerations:  Present prolonged decelerations 1-2x per hour down to 90s-120s, resolve with interventions.  UC:   irregular, every 1-3 minutes SVE:   Dilation: 4 Effacement (%): 50 Station: -3 Exam by:: Sharen Counter, CNM Attempt to AROM, fetal head applied but high station and posterior cervix.  Second attempt to AROM  by inserting IUPC and CNM not successful.    Labs: Lab Results  Component Value Date   WBC 9.9 02/22/2022   HGB 13.2 02/22/2022   HCT 38.1 02/22/2022   MCV 87.4 02/22/2022   PLT 168 02/22/2022    Assessment / Plan: IOL for ECV # 2 at term  Labor:  Labor not progressing, tachysystole/irritability with Pitocin, unable to titrate up due to poor rest between contractions.  Pitocin off at this time for Pit break, plan to restart, attempt AROM in 3-4 hours. Preeclampsia:   n/a Fetal Wellbeing:  Category II Pain Control:  Labor support without medications I/D:   GBS neg Anticipated MOD:  unsure  Sharen Counter, CNM 02/23/2022, 7:33 AM

## 2022-02-23 NOTE — Discharge Summary (Signed)
Postpartum Discharge Summary  Date of Service updated***     Patient Name: Autumn Stephenson DOB: [birth year] MRN: [id number]  Date of admission: 02/21/2022 Delivery date:02/23/2022  Delivering provider: Janyth Pupa  Date of discharge: 02/23/2022  Admitting diagnosis: Breech malpresentation successfully converted to cephalic presentation [M60.0KH9] Intrauterine pregnancy: [redacted]w[redacted]d    Secondary diagnosis:  Principal Problem:   Breech malpresentation successfully converted to cephalic presentation Active Problems:   Cesarean delivery delivered   Arrest of dilation, delivered, current hospitalization  Additional problems: Placenta adherent to uterus    Discharge diagnosis: {DX.:23714}                                              Post partum procedures:{Postpartum procedures:23558} Augmentation: Pitocin, Cytotec, and IP Foley Complications: Adherent placenta noted during C-section.  Hospital course: Induction of Labor With Cesarean Section   31y.o. yo G2P1011 at 353w4das admitted to the hospital 02/21/2022 for induction of labor. Initially had an abnormal lie, required ECV X2, and then was induced. Patient had a labor course significant for slow progress, and multiple episodes of tacysystole, requiring discontinuation of oxytocin temporarily. The patient went for cesarean section due to Arrest of Dilation. Delivery details are as follows: Membrane Rupture Time/Date: Artifical, intraop ,   Delivery Method:C-Section, Low Transverse  Details of operation can be found in separate operative Note. Placenta adherent to the anterior uterine wall, and concerning for possibly accreta in future pregnancy. Patient had an uncomplicated postpartum course. She is ambulating, tolerating a regular diet, passing flatus, and urinating well.  Patient is discharged home in stable condition on 02/23/22.      PLEASE refer to op note- there was high concern for future pregnancy and risk of placenta accreta in  the setting of previa and or placenta previa***  Newborn Data: Birth date:02/23/2022  Birth time:10:24 AM  Gender:Female  Living status:Living  Apgars:9 ,9  Weight:3240 g                                Magnesium Sulfate received: No BMZ received: No Rhophylac:N/A MMR:Yes T-DaP:Given prenatally Flu: N/A Transfusion:{Transfusion received:30440034}  Physical exam  Vitals:   02/23/22 0900 02/23/22 1120 02/23/22 1130 02/23/22 1145  BP: 111/74 115/60 101/77 113/85  Pulse: 71 65 65 69  Resp: '15 15 13 20  ' Temp:  98 F (36.7 C)  98.5 F (36.9 C)  TempSrc:    Oral  SpO2:  100% 99% 100%   General: {Exam; general:21111117} Lochia: {Desc; appropriate/inappropriate:30686::"appropriate"} Uterine Fundus: {Desc; firm/soft:30687} Incision: {Exam; incision:21111123} DVT Evaluation: {Exam; dvt:2111122} Labs: Lab Results  Component Value Date   WBC 9.9 02/22/2022   HGB 13.2 02/22/2022   HCT 38.1 02/22/2022   MCV 87.4 02/22/2022   PLT 168 02/22/2022      Latest Ref Rng & Units 02/15/2022    2:19 PM  CMP  Glucose 70 - 99 mg/dL 86   BUN 6 - 20 mg/dL 11   Creatinine 0.44 - 1.00 mg/dL 0.80   Sodium 135 - 145 mmol/L 137   Potassium 3.5 - 5.1 mmol/L 4.2   Chloride 98 - 111 mmol/L 108   CO2 22 - 32 mmol/L 23   Calcium 8.9 - 10.3 mg/dL 8.9   Total Protein 6.5 - 8.1 g/dL 6.2  Total Bilirubin 0.3 - 1.2 mg/dL 0.3   Alkaline Phos 38 - 126 U/L 158   AST 15 - 41 U/L 24   ALT 0 - 44 U/L 18    Edinburgh Score:     No data to display           After visit meds:  Allergies as of 02/23/2022   No Known Allergies   Med Rec must be completed prior to using this [facility]***        Discharge home in stable condition Infant Feeding: Breast Infant Disposition:home with mother Discharge instruction: per After Visit Summary and Postpartum booklet. Activity: Advance as tolerated. Pelvic rest for 6 weeks.  Diet: routine diet Future Appointments: Future Appointments  Date Time  Provider [place]  03/27/2022  1:35 PM Gabriel Carina, CNM [facility]   Follow up Visit:  The following message was sent to [facility] by Mikki Santee, MD Please schedule this patient for a In person postpartum visit in 6 weeks with the following provider: Any provider. Additional Postpartum F/U:Incision check 1 week  Low risk pregnancy complicated by:  unstable fetal presentation Delivery mode:  C-Section, Low Transverse  Anticipated Birth Control:  OCPs   02/23/2022 Autumn Maggio Sherrilyn Rist, MD

## 2022-02-23 NOTE — Progress Notes (Signed)
At bedside to review management.  Pt remains 4cm with no further cervical dilation.  Risk benefits and alternatives of cesarean section were discussed with the patient including but not limited to infection, bleeding, damage to bowel , bladder and baby with the need for further surgery. Pt voiced understanding and desires to proceed.  OR notified.  Myna Hidalgo, DO Attending Obstetrician & Gynecologist, [facility] for [employer], [facility]

## 2022-02-23 NOTE — Op Note (Signed)
Autumn Stephenson PROCEDURE DATE: 02/23/2022  PREOPERATIVE DIAGNOSIS: Intrauterine pregnancy at  [redacted]w[redacted]d weeks gestation; failure to progress: arrest of dilation  POSTOPERATIVE DIAGNOSIS: The same  PROCEDURE:     Cesarean Section  SURGEON:  Dr. Myna Hidalgo  ASSISTANT: Autumn Ferguson, MD  FINDINGS:   Placenta adherent to anterior uterine wall; with traction, uterine involution was noted. Placenta was eventually removed with extreme difficulty in pieces. Manual sweep of the uterus was performed and effort was made to ensure complete removal of the placenta pieces and trailing membranes. Uterus noted to be boggy, required an extended period of bimanual massage, as well as multiple uterotonics (Oxytocin, methergine, misoprostol and TXA) following which it slowly firmed up. Three vessel cord.  Normal uterus, fallopian tubes and ovaries bilaterally.  Viable female infant in cephalic presentation.  Apgars 9 and 9, weight, 7lbs 2.3 oz, Meconium stained amniotic fluid.   ANESTHESIA:   Spinal INTRAVENOUS FLUIDS: 800 ml ESTIMATED BLOOD LOSS: 575 ml URINE OUTPUT:  200 ml SPECIMENS: Placenta sent to pathology/L&D COMPLICATIONS: None immediate  PROCEDURE IN DETAIL:  The patient received intravenous antibiotics and had sequential compression devices applied to her lower extremities while in the preoperative area.  She was then taken to the operating room where anesthesia was induced and was found to be adequate. A foley catheter was placed into her bladder and attached to constant gravity. She was then placed in a dorsal supine position with a leftward tilt, and prepped and draped in a sterile manner. After an adequate timeout was performed, a Pfannenstiel skin incision was made with scalpel and carried through to the underlying layer of fascia. The fascia was incised in the midline and this incision was extended bilaterally using the Mayo scissors. Kocher clamps were applied to the superior aspect of the  fascial incision and the underlying rectus muscles were dissected off bluntly. A similar process was carried out on the inferior aspect of the facial incision. The rectus muscles were separated in the midline bluntly and the peritoneum was entered bluntly. The Alexis self-retaining retractor was introduced into the abdominal cavity. Attention was turned to the lower uterine segment where a bladder flap was created, and a transverse hysterotomy was made with a scalpel and extended bilaterally bluntly. The infant was successfully delivered, and cord was clamped and cut and infant was handed over to awaiting neonatology team. Uterine massage was then administered and the placenta delivered intact with three-vessel cord. Placenta adherent to the uterus with findings as stated above.  The uterus was cleared of clot and debris.  The hysterotomy was closed with 0 Vicryl in a 1 layer running unlocked fashion. Overall, excellent hemostasis was noted. The peritoneum was reapproximated using 0 vicryl interrupted stitches. The fascia was then closed using 0 Vicryl in a running fashion.  The subcutaneous layer was reapproximated with plain gut and the skin was closed in a subcuticular fashion using 3.0 Vicryl. The patient tolerated the procedure well. Sponge, lap, instrument and needle counts were correct x 2. She was taken to the recovery room in stable condition.   For future pregnancy, strong concern of placenta accreta in the setting of previa and/or placenta previa  Sheppard Evens MD MPH OB Fellow, Faculty Practice [facility], Center for [facility] 02/23/2022

## 2022-02-23 NOTE — Anesthesia Procedure Notes (Signed)
Spinal  Patient location during procedure: OR Start time: 02/23/2022 9:52 AM End time: 02/23/2022 9:56 AM Reason for block: surgical anesthesia Staffing Performed: anesthesiologist  Anesthesiologist: Mal Amabile, MD Performed by: Mal Amabile, MD Authorized by: Mal Amabile, MD   Preanesthetic Checklist Completed: patient identified, IV checked, site marked, risks and benefits discussed, surgical consent, monitors and equipment checked, pre-op evaluation and timeout performed Spinal Block Patient position: sitting Prep: DuraPrep Patient monitoring: heart rate, cardiac monitor, continuous pulse ox and blood pressure Approach: midline Location: L3-4 Injection technique: single-shot Needle Needle type: Pencan  Needle gauge: 24 G Needle length: 9 cm Needle insertion depth: 6 cm Assessment Sensory level: T4 Events: CSF return Additional Notes Patient tolerated procedure well. Adequate sensory level.

## 2022-02-24 LAB — CBC
HCT: 33.7 % — ABNORMAL LOW (ref 36.0–46.0)
Hemoglobin: 11.6 g/dL — ABNORMAL LOW (ref 12.0–15.0)
MCH: 30.1 pg (ref 26.0–34.0)
MCHC: 34.4 g/dL (ref 30.0–36.0)
MCV: 87.3 fL (ref 80.0–100.0)
Platelets: 121 10*3/uL — ABNORMAL LOW (ref 150–400)
RBC: 3.86 MIL/uL — ABNORMAL LOW (ref 3.87–5.11)
RDW: 12.6 % (ref 11.5–15.5)
WBC: 12.5 10*3/uL — ABNORMAL HIGH (ref 4.0–10.5)
nRBC: 0 % (ref 0.0–0.2)

## 2022-02-24 NOTE — Progress Notes (Signed)
POSTPARTUM PROGRESS NOTE  POD #1  Subjective:  Autumn Stephenson is a 31 y.o. G2P1011 s/p pLTCS at [redacted]w[redacted]d.  She reports she doing well. No acute events overnight. She reports she is doing well. She denies any problems with ambulating, voiding or po intake. Denies nausea or vomiting. She has not passed flatus. Pain is moderately controlled.  Lochia is appropriate.  Objective: Blood pressure (!) 103/54, pulse (!) 46, temperature 97.9 F (36.6 C), temperature source Oral, resp. rate 18, last menstrual period 04/29/2021, SpO2 96 %, unknown if currently breastfeeding.  Physical Exam:  General: alert, cooperative and no distress Chest: no respiratory distress Heart:regular rate, distal pulses intact Abdomen: soft, nontender,  Uterine Fundus: firm, appropriately tender DVT Evaluation: No calf swelling or tenderness Extremities: no edema Skin: warm, dry; incision clean/dry/intact w/ honeycomb dressing in place  Recent Labs    02/23/22 1136 02/24/22 0440  HGB 13.0 11.6*  HCT 37.8 33.7*    Assessment/Plan: Autumn Stephenson is a 31 y.o. G2P1011 s/p pLTCS at [redacted]w[redacted]d for arrest of descent.  POD#1 - Doing welll; pain moderatly controlled. H/H appropriate  Routine postpartum care  OOB, ambulated  Lovenox for VTE prophylaxis Anemia: asymptomatic   Contraception: OCPS Feeding: breast  Dispo: Plan for discharge day after tomorrow .   LOS: 3 days   Derrel Nip, MD  02/24/2022, 7:43 AM

## 2022-02-24 NOTE — Progress Notes (Signed)
Circumcision Consent  Discussed with mom at bedside about circumcision.   Circumcision is a surgery that removes the skin that covers the tip of the penis, called the "foreskin." Circumcision is usually done when a boy is between 59 and 10 days old, sometimes up to 72-55 weeks old.  The most common reasons boys are circumcised include for cultural/religious beliefs or for parental preference (potentially easier to clean, so baby looks like daddy, etc).  There may be some medical benefits for circumcision:   Circumcised boys seem to have slightly lower rates of: ? Urinary tract infections (per the American Academy of Pediatrics an uncircumcised boy has a 1/100 chance of developing a UTI in the first year of life, a circumcised boy at a 06/998 chance of developing a UTI in the first year of life- a 10% reduction) ? Penis cancer (typically rare- an uncircumcised female has a 1 in 100,000 chance of developing cancer of the penis) ? Sexually transmitted infection (in endemic areas, including HIV, HPV and Herpes- circumcision does NOT protect against gonorrhea, chlamydia, trachomatis, or syphilis) ? Phimosis: a condition where that makes retraction of the foreskin over the glans impossible (0.4 per 1000 boys per year or 0.6% of boys are affected by their 15th birthday)  Boys and men who are not circumcised can reduce these extra risks by: ? Cleaning their penis well ? Using condoms during sex  What are the risks of circumcision?  As with any surgical procedure, there are risks and complications. In circumcision, complications are rare and usually minor, the most common being: ? Bleeding- risk is reduced by holding each clamp for 30 seconds prior to a cut being made, and by holding pressure after the procedure is done ? Infection- the penis is cleaned prior to the procedure, and the procedure is done under sterile technique ? Damage to the urethra or amputation of the penis  How is circumcision done  in baby boys?  The baby will be placed on a special table and the legs restrained for their safety. Numbing medication is injected into the penis, and the skin is cleansed with betadine to decrease the risk of infection.   What to expect:  The penis will look red and raw for 5-7 days as it heals. We expect scabbing around where the cut was made, as well as clear-pink fluid and some swelling of the penis right after the procedure. If your baby's circumcision starts to bleed or develops pus, please contact your pediatrician immediately.  All questions were answered and mother consented.  Celedonio Savage, MD 7:43 AM

## 2022-02-25 NOTE — Progress Notes (Signed)
Patient ID: Autumn Stephenson, female   DOB: [birth year], 31 y.o.   MRN: [id number]  POSTPARTUM PROGRESS NOTE  POD #2  Subjective:  Autumn Stephenson is a 31 y.o. G2P1011 s/p LTCS at [redacted]w[redacted]d.  She reports she doing well. No acute events overnight. She reports she is doing well. She denies any problems with ambulating, voiding or po intake. Denies nausea or vomiting. She has passed flatus. Pain is well controlled.  Lochia is scant.  Objective: Blood pressure 109/65, pulse 60, temperature 97.7 F (36.5 C), temperature source Oral, resp. rate 18, last menstrual period 04/29/2021, SpO2 96 %, unknown if currently breastfeeding.  Physical Exam:  General: alert, cooperative and no distress Chest: no respiratory distress Heart:regular rate, distal pulses intact Abdomen: soft, nontender,  Uterine Fundus: firm, appropriately tender DVT Evaluation: No calf swelling or tenderness Extremities: no edema Skin: warm, dry; incision clean/dry/intact w/ honeycomb dressing in place  Recent Labs    02/23/22 1136 02/24/22 0440  HGB 13.0 11.6*  HCT 37.8 33.7*    Assessment/Plan: Autumn Stephenson is a 31 y.o. G2P1011 s/p LTCS at [redacted]w[redacted]d for AOD.  POD#2 - Doing welll; pain well controlled. H/H appropriate  Routine postpartum care  OOB, ambulated Contraception: POPs Feeding: breast/formula  Dispo: Plan for discharge today.  Drucie Opitz, SNM  02/25/2022, 7:23 AM

## 2022-02-25 NOTE — Lactation Note (Signed)
This note was copied from a baby's chart. Lactation Consultation Note  Patient Name: Autumn Stephenson Date: 30/04/2022 Reason for consult: Follow-up assessment;Difficult latch;Term Age:30 hours  Parent states baby has not fed since 430.  Parent pumped; 51ml collected. Parents feel baby latched well after birth then has had issues since.    LC assisted with feeding. Baby unable to sustain latch.  Grasped nipple only then slips off.  Multiple attempts and positions tried, baby slips only onto nipple. NS used; baby does not latch deeply and nipple shield is visible.   NS filled with 5 ml colostrum.  Baby latched slightly deeper and sucked but puckering was noted and baby stopped after taking milk.  LC suggested supplementation since baby is rooting, cueing, and has not been latching well. Suggested bottle feeding her milk first then bottle feeding DBM.  Feed every 3 hours or with cues.   Mom will pump every 3 hours.  Continue skin to skin and latch attempts.    OP LC message sent.    Maternal Data Has patient been taught Hand Expression?: Yes  Feeding Mother's Current Feeding Choice: Breast Milk (plans to give [facility])  LATCH Score Latch: Repeated attempts needed to sustain latch, nipple held in mouth throughout feeding, stimulation needed to elicit sucking reflex.  Audible Swallowing: None  Type of Nipple: Everted at rest and after stimulation (shorter shaft, compressible tissue)  Comfort (Breast/Nipple): Soft / non-tender  Hold (Positioning): Assistance needed to correctly position infant at breast and maintain latch.  LATCH Score: 6   Lactation Tools Discussed/Used Tools: Pump;Nipple Shields Nipple shield size: 20 Breast pump type: Double-Electric Breast Pump Reason for Pumping: baby not latching Pumping frequency: encouraged every 3 hours Pumped volume: 5 mL  Interventions Interventions: Breast feeding basics reviewed;Skin to skin;Assisted with latch;Breast  massage;Hand express;Support pillows;Position options;Expressed milk;Education;LC Services brochure  Discharge Discharge Education: Outpatient recommendation;Outpatient Epic message sent  Consult Status Consult Status: Follow-up Follow-up type: In-patient    Maryruth Hancock [facility] 30/04/2022, 12:31 AM

## 2022-02-25 NOTE — Social Work (Signed)
CSW consulted for ADHD. CSW reviewed chart and is screening out consult as it does not meet criteria for automatic CSW involvement. MOB has no MH hx. MOB actively taking Vyvanse to manage ADHD.  Please contact the Clinical Social Worker if needs arise or by MOB request.   Wende Neighbors, LCSWA Clinical Social Worker [phone]

## 2022-02-26 ENCOUNTER — Other Ambulatory Visit (HOSPITAL_COMMUNITY): Payer: Self-pay

## 2022-02-26 LAB — SURGICAL PATHOLOGY

## 2022-02-26 MED ORDER — OXYCODONE HCL 5 MG PO TABS
5.0000 mg | ORAL_TABLET | ORAL | 0 refills | Status: DC | PRN
  Filled 2022-02-26: qty 15, 3d supply, fill #0

## 2022-02-26 MED ORDER — NORETHINDRONE 0.35 MG PO TABS
1.0000 | ORAL_TABLET | Freq: Every day | ORAL | 11 refills | Status: DC
  Filled 2022-02-26 – 2022-03-22 (×2): qty 28, 28d supply, fill #0

## 2022-02-26 NOTE — Lactation Note (Signed)
This note was copied from a baby's chart. Lactation Consultation Note  Patient Name: Autumn Stephenson Date: 02/26/2022 Reason for consult: Follow-up assessment;Term;Primapara;1st time breastfeeding Age:30 hours   P1: Term infant at 39+4 weeks Feeding preference: Breast/formula (supplementation until milk comes to volume)  Birth parent finishing pumping when I arrived; 30 mls obtained.  Reviewed feeding plan for after discharge.  Encouraged to continue to breast feed prior to giving any supplementation.  Birth parent will provide her expressed milk before formula.  Family has worked with SLP and has received recommendations.  "Autumn Stephenson" is feeding with the Dr. Theora Gianotti ultra preemie nipple.  Parents have no difficulty feeding.  Encouraged to feed 8-12 times/24 hours or sooner if "Autumn Stephenson" shows cues.  Supplement as needed and pump after feedings until milk supply is in full volume.  Discussed engorgement prevention/treatment.  Family has our op phone number for any concerns after discharge.  Previous lc discussed op lc visits as desired.  Baby has been discharged.   Maternal Data    Feeding Mother's Current Feeding Choice: Breast Milk and Formula Nipple Type: Dr. Cline Crock  LATCH Score                    Lactation Tools Discussed/Used    Interventions    Discharge Discharge Education: Engorgement and breast care Pump: Personal  Consult Status Consult Status: Complete Date: 02/26/22 Follow-up type: Call as needed    Autumn Stephenson R Autumn Stephenson 02/26/2022, 12:13 PM

## 2022-03-04 ENCOUNTER — Other Ambulatory Visit: Payer: Self-pay

## 2022-03-04 ENCOUNTER — Ambulatory Visit (INDEPENDENT_AMBULATORY_CARE_PROVIDER_SITE_OTHER): Payer: 59

## 2022-03-04 NOTE — Progress Notes (Signed)
Pt here today for wound check. Pt had C-section on 02/23/2022.  Pt denies any drainage, redness or fever to site.   Incision today is clean, dry, intact and well approximated. Pt had honey comb dressing and Steri-strips that were removed Sat at home. Pt tolerated well.   Pt advised to keep clean and dry. Pt verbalized understanding.   Pt has scheduled PP visit on 03/27/2022.   Colletta Maryland, RN

## 2022-03-27 ENCOUNTER — Ambulatory Visit (INDEPENDENT_AMBULATORY_CARE_PROVIDER_SITE_OTHER): Payer: 59 | Admitting: Certified Nurse Midwife

## 2022-03-27 ENCOUNTER — Encounter: Payer: Self-pay | Admitting: Certified Nurse Midwife

## 2022-03-27 ENCOUNTER — Other Ambulatory Visit (HOSPITAL_COMMUNITY): Payer: Self-pay

## 2022-03-27 ENCOUNTER — Other Ambulatory Visit: Payer: Self-pay

## 2022-03-27 DIAGNOSIS — Z3009 Encounter for other general counseling and advice on contraception: Secondary | ICD-10-CM

## 2022-03-27 MED ORDER — SLYND 4 MG PO TABS: 1.0000 | ORAL_TABLET | Freq: Every day | ORAL | 11 refills | Status: DC

## 2022-03-28 NOTE — Progress Notes (Signed)
Postpartum Visit Note  Autumn Stephenson is a 31 y.o. G42P1011 female who presents for a postpartum visit. She is 4 weeks 4 days postpartum following a primary cesarean section.  I have fully reviewed the prenatal and intrapartum course. The delivery was at 39w 4d.  Anesthesia: spinal. Postpartum course has been uncomplicated. Baby is doing well. Baby is feeding by breast. Bleeding no bleeding. Bowel function is normal. Bladder function is normal. Patient is sexually active. Contraception method is oral progesterone-only contraceptive. Postpartum depression screening: negative.   Upstream - 03/28/22 1000       Pregnancy Intention Screening   Does the patient want to become pregnant in the next year? No    Does the patient's partner want to become pregnant in the next year? No    Would the patient like to discuss contraceptive options today? Yes      Contraception Wrap Up   Current Method Abstinence    End Method Oral Contraceptive    Contraception Counseling Provided Yes    How was the end contraceptive method provided? Prescription            The pregnancy intention screening data noted above was reviewed. Potential methods of contraception were discussed. The patient elected to proceed with Oral Contraceptive.   Edinburgh Postnatal Depression Scale - 03/27/22 1352       Edinburgh Postnatal Depression Scale:  In the Past 7 Days   I have been able to laugh and see the funny side of things. 0    I have looked forward with enjoyment to things. 0    I have blamed myself unnecessarily when things went wrong. 0    I have been anxious or worried for no good reason. 0    I have felt scared or panicky for no good reason. 0    Things have been getting on top of me. 0    I have been so unhappy that I have had difficulty sleeping. 0    I have felt sad or miserable. 0    I have been so unhappy that I have been crying. 0    The thought of harming myself has occurred to me. 0    Edinburgh  Postnatal Depression Scale Total 0            Health Maintenance Due  Topic Date Due   COVID-19 Vaccine (1) Never done   HPV VACCINES (2 - 3-dose series) 07/15/2008   INFLUENZA VACCINE  01/15/2022   PAP-Cervical Cytology Screening  03/18/2022   PAP SMEAR-Modifier  03/18/2022   The following portions of the patient's history were reviewed and updated as appropriate: allergies, current medications, past family history, past medical history, past social history, past surgical history, and problem list.  Review of Systems Pertinent items noted in HPI and remainder of comprehensive ROS otherwise negative.  Objective:  BP 122/88   Pulse (!) 109   Wt 137 lb (62.1 kg)   LMP  (LMP Unknown)   Breastfeeding Yes   BMI 25.06 kg/m    Constitutional: Alert, oriented female in no physical distress.  HEENT: PERRLA Skin: normal color and turgor, no rash Cardiovascular: normal rate & rhythm Respiratory: normal effort, no problems with respiration noted GI: Abd soft, non-tender MS: Extremities nontender, no edema, normal ROM Neurologic: Alert and oriented x 4.  GU: no CVA tenderness Pelvic: exam deferred Breasts: normal lactating breasts, no edema or signs of nipple damage Incision: well-approximated without exudate, redness or edema.  Assessment:  1. Postpartum care and examination  2. Cesarean delivery delivered - Reviewed events of delivery and affirmed mixed emotions  Plan:   Essential components of care per ACOG recommendations:  1.  Mood and well being: Patient with negative depression screening today. Reviewed local resources for support.  - Patient tobacco use? No.   - hx of drug use? No.    2. Infant care and feeding:  -Patient currently breastmilk feeding? Yes. Reviewed importance of draining breast regularly to support lactation.  -Social determinants of health (SDOH) reviewed in EPIC. No concerns  3. Sexuality, contraception and birth spacing - Patient does not  want a pregnancy in the next year.  Desired family size is currently unknown. - Reviewed reproductive life planning. Reviewed contraceptive methods based on pt preferences and effectiveness.  Patient desired Oral Contraceptive today.   - Discussed birth spacing of 18 months  4. Sleep and fatigue -Encouraged family/partner/community support of 4 hrs of uninterrupted sleep to help with mood and fatigue  5. Physical Recovery  - Discussed patients delivery and complications. She describes her labor as mixed. - Patient had a C-section failure to progress with possible placenta accreta.  - Patient has urinary incontinence? No. - Patient is safe to resume physical and sexual activity  6.  Health Maintenance - HM due items addressed Yes - Last pap smear 10/16/19. This was normal per patient. Pap smear not done at today's visit.  -Breast Cancer screening indicated? No.   7. Chronic Disease/Pregnancy Condition follow up: None - PCP follow up  Gabriel Carina, [place] [facility] [place]

## 2022-03-30 ENCOUNTER — Telehealth: Payer: 59 | Admitting: Physician Assistant

## 2022-03-30 DIAGNOSIS — N61 Mastitis without abscess: Secondary | ICD-10-CM | POA: Diagnosis not present

## 2022-03-30 MED ORDER — CEPHALEXIN 500 MG PO CAPS: 500.0000 mg | ORAL_CAPSULE | Freq: Three times a day (TID) | ORAL | 0 refills | Status: DC

## 2022-03-30 NOTE — Patient Instructions (Signed)
Autumn Bali [place], thank you for joining Mar Daring, PA-C for today's virtual visit.  While this provider is not your primary care provider (PCP), if your PCP is located in our provider database this encounter information will be shared with them immediately following your visit.  [facility] account gives you access to today's visit and all your visits, tests, and labs performed at [facility] " click here if you don't have a [facility] account or go to [email or web address]  Consent: (Patient) Autumn Stephenson provided verbal consent for this virtual visit at the beginning of the encounter.  Current Medications:  Current Outpatient Medications:    cephALEXin (KEFLEX) 500 MG capsule, Take 1 capsule (500 mg total) by mouth 3 (three) times daily., Disp: 21 capsule, Rfl: 0   Drospirenone (SLYND) 4 MG TABS, Take 1 tablet by mouth daily., Disp: 28 tablet, Rfl: 11   Prenatal Vit-Fe Fumarate-FA (PRENATAL MULTIVITAMIN) TABS tablet, Take 1 tablet by mouth daily at 12 noon., Disp: , Rfl:    Medications ordered in this encounter:  Meds ordered this encounter  Medications   cephALEXin (KEFLEX) 500 MG capsule    Sig: Take 1 capsule (500 mg total) by mouth 3 (three) times daily.    Dispense:  21 capsule    Refill:  0    Order Specific Question:   Supervising Provider    Answer:   Chase Picket [id number]     *If you need refills on other medications prior to your next appointment, please contact your pharmacy*  Follow-Up: Call back or seek an in-person evaluation if the symptoms worsen or if the condition fails to improve as anticipated.  [place] [phone]  Other Instructions  Mastitis  Mastitis is inflammation of the breast tissue. It occurs most often in women who are breastfeeding, but it can also affect other women, and sometimes even men. Mastitis will sometimes go away on its own. A health care provider will help  determine if medical treatment is needed. What are the causes? This condition is usually caused by a bacterial infection. Bacteria can enter the breast tissue through cuts, cracks, or openings in the skin. This usually occurs with breastfeeding because of cracked or irritated nipples. Sometimes, mastitis can occur when there are no cuts or openings in the skin. This is usually caused by plugged milk ducts. Plugged milk ducts block the flow of milk in the breast. Other causes include: Nipple piercing. Some forms of breast cancer. What are the signs or symptoms? Symptoms of this condition include: Swelling, redness, tenderness, and pain in an area of the breast. The area may also feel warm to the touch. These symptoms usually affect the upper part of the breast, toward the armpit region. Swelling of the glands under the arm on the same side. Discharge from the nipple. Fatigue, headache, and flu-like muscle aches. Fever and chills. Nausea and vomiting. Rapid pulse. Symptoms usually last 2-5 days. Breast pain and redness are at their worst on days 2 and 3, and they usually go away by day 5. If an infection is left to worsen, a collection of pus, or an abscess, may develop. How is this diagnosed? This condition can usually be diagnosed based on a physical exam and your symptoms. You may also have other tests, such as: Blood tests to check if your body is fighting an infection from bacteria. Mammogram or ultrasound tests to rule out other problems or diseases. Fluid tests. If  an abscess has developed, the fluid in the abscess may be removed with a needle. The fluid may be checked to see whether bacteria are present. Breast milk testing. A sample of breast milk may be tested for bacteria. This is done only when breastfeeding or pumping. How is this treated? Mastitis that occurs with breastfeeding will sometimes go away on its own, so your health care provider may choose to wait 24 hours after first  seeing you to decide whether a prescription medicine is needed. If treatment is needed, it may include: Continuing to breastfeed or pump from both breasts to allow adequate milk flow and prevent an abscess from forming. Applying hot or cold compresses to the affected area. Medicine for pain. Antibiotic medicine to treat a bacterial infection. Self-care, such as rest and drinking more fluids. Using a needle to remove fluid from an abscess if one has developed. Follow these instructions at home: Breast care  Keep your nipples clean and dry. If directed, apply heat to the affected area of your breast. Use the heat source that your health care provider or lactation specialist recommends. If directed, place ice on the affected area of your breast. To do this: Put ice in a plastic bag. Place a towel between your skin and the bag. Leave the ice on for 20 minutes, 2-3 times a day. Remove the ice if your skin turns bright red. This is very important. If you cannot feel pain, heat, or cold, you have a greater risk of damage to the area. Breastfeeding and pumping tips Continue to breastfeed your baby on demand. This means feeding your baby whenever he or she is hungry. Ask your health care provider or lactation specialist whether you need to change your breastfeeding or pumping routine. Avoid using nipple shields for feedings, if possible. Ask a lactation specialist for assistance if needed. Alternate the breast you offer first at each feeding to make sure your baby feeds from both breasts equally. Offer both breasts to your baby at every feeding. Use gentle breast massage during feeding or pumping sessions only as told by your health care provider or lactation specialist. Avoid allowing your breasts to become very full with milk (engorged). If your breasts are engorged, you can hand express a small amount of milk for comfort. If you are pumping, continue to pump on the same schedule as you were  before. In the breast with mastitis, pump until very little milk is coming out. Do not empty your breast. Emptying your breast causes your body to make more milk and can make symptoms worse. Ask your health care provider or lactation specialist whether you need to change your pumping routine. Medicines Take over-the-counter and prescription medicines as told by your health care provider. If you were prescribed an antibiotic medicine, take it as told by your health care provider. Do not stop using the antibiotic even if you start to feel better. Contact a health care provider if: You have pus-like discharge from the breast. You have a fever. Your symptoms do not improve within 2 days of starting treatment. Get help right away if: Your pain and swelling are getting worse. You have pain that is not controlled with medicine. You have a red line extending from the breast toward your armpit. Summary Mastitis is inflammation of the breast tissue. It occurs most often in women who are breastfeeding, but it can also affect non-breastfeeding women and some men. This condition is usually caused by a bacterial infection. This condition may  be treated with hot and cold compresses, medicines, self-care, and breastfeeding. If you were prescribed an antibiotic medicine, take it as told by your health care provider. Do not stop using the antibiotic even if you start to feel better. This information is not intended to replace advice given to you by your health care provider. Make sure you discuss any questions you have with your health care provider. Document Revised: 07/06/2021 Document Reviewed: 04/03/2020 Elsevier Patient Education  2023 Elsevier Inc.    If you have been instructed to have an in-person evaluation today at a local Urgent Care facility, please use the link below. It will take you to a list of all of our available [facility], including address, phone number and hours of  operation. Please do not delay care.  [facility]  If you or a family member do not have a primary care provider, use the link below to schedule a visit and establish care. When you choose a [facility] primary care physician or advanced practice provider, you gain a long-term partner in health. Find a Primary Care Provider  Learn more about [facility]'s in-office and virtual care options: [facility] - Get Care Now

## 2022-03-30 NOTE — Progress Notes (Signed)
Virtual Visit Consent   [name], you are scheduled for a virtual visit with a [facility] provider today. Just as with appointments in the office, your consent must be obtained to participate. Your consent will be active for this visit and any virtual visit you may have with one of our providers in the next 365 days. If you have a MyChart account, a copy of this consent can be sent to you electronically.  As this is a virtual visit, video technology does not allow for your provider to perform a traditional examination. This may limit your provider's ability to fully assess your condition. If your provider identifies any concerns that need to be evaluated in person or the need to arrange testing (such as labs, EKG, etc.), we will make arrangements to do so. Although advances in technology are sophisticated, we cannot ensure that it will always work on either your end or our end. If the connection with a video visit is poor, the visit may have to be switched to a telephone visit. With either a video or telephone visit, we are not always able to ensure that we have a secure connection.  By engaging in this virtual visit, you consent to the provision of healthcare and authorize for your insurance to be billed (if applicable) for the services provided during this visit. Depending on your insurance coverage, you may receive a charge related to this service.  I need to obtain your verbal consent now. Are you willing to proceed with your visit today? Autumn Stephenson has provided verbal consent on 03/30/2022 for a virtual visit (video or telephone). Mar Daring, PA-C  Date: 03/30/2022 3:46 PM  Virtual Visit via Video Note   I, Mar Daring, connected with  Autumn Stephenson  ([id number], May 27, 1991) on 03/30/22 at  3:45 PM EDT by a video-enabled telemedicine application and verified that I am speaking with the correct person using two identifiers.  Location: Patient: Virtual Visit  Location Patient: Home Provider: Virtual Visit Location Provider: Home Office   I discussed the limitations of evaluation and management by telemedicine and the availability of in person appointments. The patient expressed understanding and agreed to proceed.    History of Present Illness: Autumn Stephenson is a 31 y.o. who identifies as a female who was assigned female at birth, and is being seen today for possible mastitis. She is breastfeeding. Son is month old. Right breast is affected. Right breast is full, swollen, warm and tender to the touch. Denies any purulent or bloody nipple discharge. Denies fevers, chills, nausea, vomiting.   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  Resting comfortably at home.  Head is normocephalic, atraumatic.  No labored breathing.  Speech is  clear and coherent with logical content.  Patient is alert and oriented at baseline.    Assessment and Plan: 1. Mastitis - cephALEXin (KEFLEX) 500 MG capsule; Take 1 capsule (500 mg total) by mouth 3 (three) times daily.  Dispense: 21 capsule; Refill: 0  - Suspect Mastitis - Discussed warm compresses and massage - Keflex prescribed -  Tylenol as needed for pain - Seek in person evaluation if worsening or fails to improve  Follow Up Instructions: I discussed the assessment and treatment plan with the patient. The patient was provided an opportunity to ask questions and all were answered. The patient agreed with the plan and demonstrated an understanding of the instructions.  A copy of instructions were sent to the patient via MyChart unless otherwise noted below.    The patient was advised to call back or seek an in-person evaluation if the symptoms worsen or if the condition fails to improve as anticipated.  Time:  I spent 8 minutes with the patient via telehealth technology discussing the above problems/concerns.    Mar Daring, PA-C

## 2022-04-08 ENCOUNTER — Encounter: Payer: Self-pay | Admitting: Certified Nurse Midwife

## 2022-04-08 DIAGNOSIS — L508 Other urticaria: Secondary | ICD-10-CM

## 2022-04-10 MED ORDER — PREDNISONE 5 MG PO TABS: 10.0000 mg | ORAL_TABLET | Freq: Every day | ORAL | 0 refills | Status: AC

## 2022-04-27 ENCOUNTER — Telehealth: Payer: 59 | Admitting: Physician Assistant

## 2022-04-27 DIAGNOSIS — N61 Mastitis without abscess: Secondary | ICD-10-CM | POA: Diagnosis not present

## 2022-04-27 MED ORDER — CEPHALEXIN 500 MG PO CAPS: 500.0000 mg | ORAL_CAPSULE | Freq: Four times a day (QID) | ORAL | 0 refills | Status: DC

## 2022-04-27 NOTE — Progress Notes (Signed)
Virtual Visit Consent   [place], you are scheduled for a virtual visit with a [facility] provider today. Just as with appointments in the office, your consent must be obtained to participate. Your consent will be active for this visit and any virtual visit you may have with one of our providers in the next 365 days. If you have a MyChart account, a copy of this consent can be sent to you electronically.  As this is a virtual visit, video technology does not allow for your provider to perform a traditional examination. This may limit your provider's ability to fully assess your condition. If your provider identifies any concerns that need to be evaluated in person or the need to arrange testing (such as labs, EKG, etc.), we will make arrangements to do so. Although advances in technology are sophisticated, we cannot ensure that it will always work on either your end or our end. If the connection with a video visit is poor, the visit may have to be switched to a telephone visit. With either a video or telephone visit, we are not always able to ensure that we have a secure connection.  By engaging in this virtual visit, you consent to the provision of healthcare and authorize for your insurance to be billed (if applicable) for the services provided during this visit. Depending on your insurance coverage, you may receive a charge related to this service.  I need to obtain your verbal consent now. Are you willing to proceed with your visit today? Autumn Stephenson has provided verbal consent on 04/27/2022 for a virtual visit (video or telephone). Inda Coke, [place]  Date: 04/27/2022 10:19 AM  Virtual Visit via Video Note   I, Inda Coke, connected with  Autumn Stephenson  ([id number], Mar 05, 1991) on 04/27/22 at 10:15 AM EST by a video-enabled telemedicine application and verified that I am speaking with the correct person using two identifiers.  Location: Patient: Virtual Visit Location  Patient: Home Provider: Virtual Visit Location Provider: Home Office   I discussed the limitations of evaluation and management by telemedicine and the availability of in person appointments. The patient expressed understanding and agreed to proceed.    History of Present Illness: Autumn Stephenson is a 31 y.o. who identifies as a female who was assigned female at birth, and is being seen today for mastitis.  Patient reports R breast pain/tenderness since last night. Her milk is cloudy. She is 2 months postpartum. She had mastitis last month that presented similarly. She denies: fevers, chills, n/v/d. She has not tried any OTC medications yet or compresses.  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  Resting comfortably  at home.  Head is normocephalic, atraumatic.  No labored breathing.  Speech is clear and coherent with logical content.  Patient is alert and oriented at baseline.   Assessment and Plan: 1. Mastitis No red flags Recommend supportive care including tylenol, ibuprofen, compression Push fluids and rest Start keflex 500 mg QID x 7 days Discussed low threshold to see in-person provider if any  further concerns  Follow Up Instructions: I discussed the assessment and treatment plan with the patient. The patient was provided an opportunity to ask questions and all were answered. The patient agreed with the plan and demonstrated an understanding of the instructions.  A copy of instructions were sent to the patient via MyChart unless otherwise noted below.   The patient was advised to call back or seek an in-person evaluation if the symptoms worsen or if the condition fails to improve as anticipated.  Time:  I spent 5-10 minutes with the patient via telehealth technology discussing the above problems/concerns.    Inda Coke, [place]

## 2022-09-19 ENCOUNTER — Other Ambulatory Visit: Payer: Self-pay | Admitting: Physician Assistant

## 2022-09-30 ENCOUNTER — Encounter: Payer: Self-pay | Admitting: *Deleted

## 2023-01-03 ENCOUNTER — Encounter: Payer: Self-pay | Admitting: Certified Nurse Midwife

## 2023-01-07 DIAGNOSIS — F908 Attention-deficit hyperactivity disorder, other type: Secondary | ICD-10-CM | POA: Diagnosis not present

## 2023-01-07 DIAGNOSIS — F3181 Bipolar II disorder: Secondary | ICD-10-CM | POA: Diagnosis not present

## 2023-02-05 DIAGNOSIS — K219 Gastro-esophageal reflux disease without esophagitis: Secondary | ICD-10-CM | POA: Diagnosis not present

## 2023-02-05 DIAGNOSIS — Z8719 Personal history of other diseases of the digestive system: Secondary | ICD-10-CM | POA: Diagnosis not present

## 2023-03-10 ENCOUNTER — Other Ambulatory Visit: Payer: Self-pay | Admitting: General Practice

## 2023-03-10 DIAGNOSIS — Z3009 Encounter for other general counseling and advice on contraception: Secondary | ICD-10-CM

## 2023-03-10 MED ORDER — SLYND 4 MG PO TABS: 1.0000 | ORAL_TABLET | Freq: Every day | ORAL | 2 refills | Status: DC

## 2023-04-01 DIAGNOSIS — F3181 Bipolar II disorder: Secondary | ICD-10-CM | POA: Diagnosis not present

## 2023-04-01 DIAGNOSIS — F908 Attention-deficit hyperactivity disorder, other type: Secondary | ICD-10-CM | POA: Diagnosis not present

## 2023-05-02 ENCOUNTER — Emergency Department (HOSPITAL_BASED_OUTPATIENT_CLINIC_OR_DEPARTMENT_OTHER)
Admission: EM | Admit: 2023-05-02 | Discharge: 2023-05-02 | Disposition: A | Discharge status: Home / Self Care | Payer: BC Managed Care – PPO

## 2023-05-02 ENCOUNTER — Other Ambulatory Visit: Payer: Self-pay

## 2023-05-02 ENCOUNTER — Encounter (HOSPITAL_BASED_OUTPATIENT_CLINIC_OR_DEPARTMENT_OTHER): Payer: Self-pay | Admitting: Emergency Medicine

## 2023-05-02 ENCOUNTER — Emergency Department (HOSPITAL_BASED_OUTPATIENT_CLINIC_OR_DEPARTMENT_OTHER): Payer: BC Managed Care – PPO

## 2023-05-02 DIAGNOSIS — R102 Pelvic and perineal pain: Secondary | ICD-10-CM | POA: Diagnosis not present

## 2023-05-02 DIAGNOSIS — R1084 Generalized abdominal pain: Secondary | ICD-10-CM | POA: Diagnosis not present

## 2023-05-02 DIAGNOSIS — J45909 Unspecified asthma, uncomplicated: Secondary | ICD-10-CM | POA: Diagnosis not present

## 2023-05-02 DIAGNOSIS — R109 Unspecified abdominal pain: Secondary | ICD-10-CM

## 2023-05-02 DIAGNOSIS — R1030 Lower abdominal pain, unspecified: Secondary | ICD-10-CM | POA: Diagnosis not present

## 2023-05-02 DIAGNOSIS — R1011 Right upper quadrant pain: Secondary | ICD-10-CM | POA: Diagnosis not present

## 2023-05-02 DIAGNOSIS — K5900 Constipation, unspecified: Secondary | ICD-10-CM | POA: Diagnosis not present

## 2023-05-02 DIAGNOSIS — R350 Frequency of micturition: Secondary | ICD-10-CM | POA: Diagnosis not present

## 2023-05-02 DIAGNOSIS — Z3202 Encounter for pregnancy test, result negative: Secondary | ICD-10-CM | POA: Diagnosis not present

## 2023-05-02 DIAGNOSIS — R1032 Left lower quadrant pain: Secondary | ICD-10-CM | POA: Diagnosis not present

## 2023-05-02 DIAGNOSIS — R1031 Right lower quadrant pain: Secondary | ICD-10-CM | POA: Diagnosis not present

## 2023-05-02 LAB — COMPREHENSIVE METABOLIC PANEL
ALT: 23 U/L (ref 0–44)
AST: 16 U/L (ref 15–41)
Albumin: 4.8 g/dL (ref 3.5–5.0)
Alkaline Phosphatase: 47 U/L (ref 38–126)
Anion gap: 7 (ref 5–15)
BUN: 15 mg/dL (ref 6–20)
CO2: 27 mmol/L (ref 22–32)
Calcium: 9.1 mg/dL (ref 8.9–10.3)
Chloride: 103 mmol/L (ref 98–111)
Creatinine, Ser: 0.74 mg/dL (ref 0.44–1.00)
GFR, Estimated: >60 mL/min (ref >60)
Glucose, Bld: 85 mg/dL (ref 70–99)
Potassium: 3.8 mmol/L (ref 3.5–5.1)
Sodium: 137 mmol/L (ref 135–145)
Total Bilirubin: 0.7 mg/dL (ref <1.2)
Total Protein: 6.9 g/dL (ref 6.5–8.1)

## 2023-05-02 LAB — CBC WITH DIFFERENTIAL/PLATELET
Abs Immature Granulocytes: 0.01 10*3/uL (ref 0.00–0.07)
Basophils Absolute: 0 10*3/uL (ref 0.0–0.1)
Basophils Relative: 0 %
Eosinophils Absolute: 0.2 10*3/uL (ref 0.0–0.5)
Eosinophils Relative: 3 %
HCT: 38.1 % (ref 36.0–46.0)
Hemoglobin: 12.9 g/dL (ref 12.0–15.0)
Immature Granulocytes: 0 %
Lymphocytes Relative: 51 %
Lymphs Abs: 3.3 10*3/uL (ref 0.7–4.0)
MCH: 28.7 pg (ref 26.0–34.0)
MCHC: 33.9 g/dL (ref 30.0–36.0)
MCV: 84.7 fL (ref 80.0–100.0)
Monocytes Absolute: 0.5 10*3/uL (ref 0.1–1.0)
Monocytes Relative: 7 %
Neutro Abs: 2.6 10*3/uL (ref 1.7–7.7)
Neutrophils Relative %: 39 %
Platelets: 266 10*3/uL (ref 150–400)
RBC: 4.5 MIL/uL (ref 3.87–5.11)
RDW: 12.6 % (ref 11.5–15.5)
WBC: 6.7 10*3/uL (ref 4.0–10.5)
nRBC: 0 % (ref 0.0–0.2)

## 2023-05-02 LAB — PREGNANCY, URINE: Preg Test, Ur: NEGATIVE

## 2023-05-02 LAB — URINALYSIS, ROUTINE W REFLEX MICROSCOPIC
Bacteria, UA: NONE SEEN
Bilirubin Urine: NEGATIVE
Glucose, UA: NEGATIVE mg/dL
Hgb urine dipstick: NEGATIVE
Ketones, ur: 15 mg/dL — AB
Leukocytes,Ua: NEGATIVE
Nitrite: NEGATIVE
Protein, ur: NEGATIVE mg/dL
Specific Gravity, Urine: 1.014 (ref 1.005–1.030)
pH: 5.5 (ref 5.0–8.0)

## 2023-05-02 MED ORDER — DICYCLOMINE HCL 20 MG PO TABS: 20.0000 mg | ORAL_TABLET | Freq: Two times a day (BID) | ORAL | 0 refills | Status: DC

## 2023-05-02 MED ORDER — IOHEXOL 350 MG/ML SOLN
100.0000 mL | Freq: Once | INTRAVENOUS | Status: AC | PRN
  Administered 2023-05-02: 100 mL via INTRAVENOUS

## 2023-05-02 NOTE — ED Provider Notes (Signed)
[place] EMERGENCY DEPARTMENT AT [facility] Provider Note   CSN: [id number] Arrival date & time: 05/02/23  1355     History  Chief Complaint  Patient presents with   Pelvic Pain    Autumn Stephenson is a 32 y.o. female with past medical history significant for asthma, ADHD presents to the ED complaining of abdominal pain and pelvic pain for the past 2-3 weeks.  Patient was sent from urgent care for further evaluation.  Patient's urine there was negative for infection.  She reports history of previous kidney infections.  She states she is also tender on the left side under her ribs when she sleeps on her side.  Patient feels she has to urinate more frequently, especially at night.  She reports normal bowel movements.  Patient has no concern for STD/STI.  Denies vaginal bleeding, discharge, dysuria, hematuria, melena, nausea, vomiting, diarrhea.        Home Medications Prior to Admission medications   Medication Sig Start Date End Date Taking? Authorizing Provider  dicyclomine (BENTYL) 20 MG tablet Take 1 tablet (20 mg total) by mouth 2 (two) times daily. 05/02/23  Yes Safir Michalec R, PA-C  cephALEXin (KEFLEX) 500 MG capsule Take 1 capsule (500 mg total) by mouth 4 (four) times daily. 04/27/22   Jarold Motto, PA  Drospirenone (SLYND) 4 MG TABS Take 1 tablet (4 mg total) by mouth daily. 03/10/23   Bernerd Limbo, CNM  Prenatal Vit-Fe Fumarate-FA (PRENATAL MULTIVITAMIN) TABS tablet Take 1 tablet by mouth daily at 12 noon.    [provider]      Allergies    Patient has no known allergies.    Review of Systems   Review of Systems  Constitutional:  Negative for chills and fever.  Gastrointestinal:  Positive for abdominal pain. Negative for blood in stool, constipation, diarrhea, nausea and vomiting.  Genitourinary:  Positive for flank pain, frequency and pelvic pain. Negative for dysuria, urgency, vaginal bleeding and vaginal discharge.    Physical  Exam Updated Vital Signs BP 115/82   Pulse 84   Temp 98 F (36.7 C) (Oral)   Resp 16   Ht 5\' 2"  (1.575 m)   Wt 52.2 kg   SpO2 95%   Breastfeeding Yes   BMI 21.03 kg/m  Physical Exam Vitals and nursing note reviewed.  Constitutional:      General: She is not in acute distress.    Appearance: Normal appearance. She is not ill-appearing or diaphoretic.  Cardiovascular:     Rate and Rhythm: Normal rate and regular rhythm.  Pulmonary:     Effort: Pulmonary effort is normal.  Abdominal:     General: Abdomen is flat. Bowel sounds are normal.     Palpations: Abdomen is soft.     Tenderness: There is generalized abdominal tenderness. There is left CVA tenderness (mild). There is no right CVA tenderness, guarding or rebound. Negative signs include Murphy's sign.     Hernia: No hernia is present.  Skin:    General: Skin is warm and dry.     Capillary Refill: Capillary refill takes less than 2 seconds.  Neurological:     Mental Status: She is alert. Mental status is at baseline.  Psychiatric:        Mood and Affect: Mood normal.        Behavior: Behavior normal.     ED Results / Procedures / Treatments   Labs (all labs ordered are listed, but only abnormal results are  displayed) Labs Reviewed  URINALYSIS, ROUTINE W REFLEX MICROSCOPIC - Abnormal; Notable for the following components:      Result Value   APPearance HAZY (*)    Ketones, ur 15 (*)    All other components within normal limits  COMPREHENSIVE METABOLIC PANEL  PREGNANCY, URINE  CBC WITH DIFFERENTIAL/PLATELET    EKG None  Radiology CT ABDOMEN PELVIS W CONTRAST  Result Date: 05/02/2023 CLINICAL DATA:  Lower abdominal and pelvic pain EXAM: CT ABDOMEN AND PELVIS WITH CONTRAST TECHNIQUE: Multidetector CT imaging of the abdomen and pelvis was performed using the standard protocol following bolus administration of intravenous contrast. RADIATION DOSE REDUCTION: This exam was performed according to the departmental  dose-optimization program which includes automated exposure control, adjustment of the mA and/or kV according to patient size and/or use of iterative reconstruction technique. CONTRAST:  OMNIPAQUE IOHEXOL 350 MG/ML SOLN COMPARISON:  None Available. FINDINGS: Lower chest: Included lung bases are clear.  Heart size is normal. Hepatobiliary: No focal liver abnormality is seen. No gallstones, gallbladder wall thickening, or biliary dilatation. Pancreas: Unremarkable. No pancreatic ductal dilatation or surrounding inflammatory changes. Spleen: Normal in size without focal abnormality. Adrenals/Urinary Tract: Unremarkable adrenal glands. Kidneys enhance symmetrically without focal lesion, stone, or hydronephrosis. Ureters are nondilated. Urinary bladder appears unremarkable for the degree of distention. Stomach/Bowel: Stomach is within normal limits. Appendix is not definitively visualized, although there are no pericecal inflammatory changes to suggest appendicitis. No evidence of bowel wall thickening, distention, or inflammatory changes. Vascular/Lymphatic: No significant vascular findings are present. No enlarged abdominal or pelvic lymph nodes. Reproductive: Retroverted uterus.  No adnexal masses. Other: No free fluid. No abdominopelvic fluid collection. No pneumoperitoneum. No abdominal wall hernia. Musculoskeletal: No acute or significant osseous findings. IMPRESSION: No acute abdominopelvic findings. Electronically Signed   By: Duanne Guess D.O.   On: 05/02/2023 20:08    Procedures Procedures    Medications Ordered in ED Medications  iohexol (OMNIPAQUE) 350 MG/ML injection 100 mL (100 mLs Intravenous Contrast Given 05/02/23 1819)    ED Course/ Medical Decision Making/ A&P                                 Medical Decision Making Amount and/or Complexity of Data Reviewed Labs: ordered. Radiology: ordered.  Risk Prescription drug management.   This patient presents to the ED with  chief complaint(s) of abdominal pain, flank pain, pelvic pain, increased urinary frequency with non-contributory past medical history.  The complaint involves an extensive differential diagnosis and also carries with it a high risk of complications and morbidity.    The differential diagnosis includes appendicitis, constipation, kidney stone, UTI, pyelonephritis   The initial plan is to obtain labs, CT imaging  Additional history obtained: Records reviewed  urgent care note  Initial Assessment:   On exam, patient is resting comfortably in bed and does not appear to be in acute distress.  Abdomen is soft with generalized tenderness, worse in the right and left lower quadrants.  No appreciable hernias.  No abdominal distention.  Left CVA mildly tender with percussion.  Vitals are stable.  Patient is afebrile.  Independent ECG/labs interpretation:  The following labs were independently interpreted:  CBC without leukocytosis or anemia.  Metabolic panel without electrolyte disturbance.  Renal and hepatic function are both normal.  UA without infection.  Pregnancy test negative.  Independent visualization and interpretation of imaging: I independently visualized the following imaging with scope of  interpretation limited to determining acute life threatening conditions related to emergency care: CT abdomen pelvis, which revealed no acute intra-abdominal or pelvic finding to explain patient's symptoms.  There is moderate amount of stool with some diffuse gas in the colon.  Disposition:   Patient's workup is overall reassuring and does not show an emergent finding for her symptoms.  This has been going on for a couple of weeks.  Will send patient home on dicyclomine and a bowel regimen to help with cramping abdominal pain as well as increased stool burden, despite patient having bowel movements.  Patient advised to follow-up with her primary care provider.  GI referral information also provided.  The  patient has been appropriately medically screened and/or stabilized in the ED. I have low suspicion for any other emergent medical condition which would require further screening, evaluation or treatment in the ED or require inpatient management. At time of discharge the patient is hemodynamically stable and in no acute distress. I have discussed work-up results and diagnosis with patient and answered all questions. Patient is agreeable with discharge plan. We discussed strict return precautions for returning to the emergency department and they verbalized understanding.           Final Clinical Impression(s) / ED Diagnoses Final diagnoses:  Generalized abdominal pain  Left flank pain    Rx / DC Orders ED Discharge Orders          Ordered    dicyclomine (BENTYL) 20 MG tablet  2 times daily        05/02/23 2102              Lenard Simmer, PA-C 05/02/23 2345    Coral Spikes, DO 05/03/23 0006

## 2023-05-02 NOTE — ED Triage Notes (Signed)
Pt c/o pelvic pain for 2 weeks. Sent from UC, urine sample was negative. Urgency and more frequent to void at night. Hx of kidney stones. Pt states she is tender on L side under her ribs when sleeping on her side.

## 2023-05-02 NOTE — Discharge Instructions (Addendum)
Thank you for allowing [place] to be a part of your care today.  You were evaluated in the ED for abdominal pain.  Your CT scan was negative for an acute abnormality such as appendicitis, bowel obstruction, or kidney stone.  You did have a moderate amount of stool and gas on your scan and likely have a degree of constipation.  Your urine did not show infection.    I recommend starting MiraLAX to help with bowel movements.  You will take 1 (17 g) capful per day mixed with 48 ounces of juice or other liquid.  Be sure to increase your fluid and fiber intake.  For gas you can take over the counter simethicone (Gas-X).    I recommend following up with your primary care provider.  Return to the ED if you develop sudden worsening of your symptoms or if you have new concerns.

## 2023-05-26 DIAGNOSIS — Z01419 Encounter for gynecological examination (general) (routine) without abnormal findings: Secondary | ICD-10-CM | POA: Diagnosis not present

## 2023-05-26 DIAGNOSIS — Z1331 Encounter for screening for depression: Secondary | ICD-10-CM | POA: Diagnosis not present

## 2023-05-26 DIAGNOSIS — Z124 Encounter for screening for malignant neoplasm of cervix: Secondary | ICD-10-CM | POA: Diagnosis not present

## 2023-05-29 DIAGNOSIS — R1030 Lower abdominal pain, unspecified: Secondary | ICD-10-CM | POA: Diagnosis not present

## 2023-05-29 DIAGNOSIS — Z1211 Encounter for screening for malignant neoplasm of colon: Secondary | ICD-10-CM | POA: Diagnosis not present

## 2023-07-01 DIAGNOSIS — R1030 Lower abdominal pain, unspecified: Secondary | ICD-10-CM | POA: Diagnosis not present

## 2023-07-02 ENCOUNTER — Ambulatory Visit: Payer: BC Managed Care – PPO | Admitting: Certified Nurse Midwife

## 2023-07-03 DIAGNOSIS — R509 Fever, unspecified: Secondary | ICD-10-CM | POA: Diagnosis not present

## 2023-07-03 DIAGNOSIS — J029 Acute pharyngitis, unspecified: Secondary | ICD-10-CM | POA: Diagnosis not present

## 2023-07-05 IMAGING — DX DG KNEE AP/LAT W/ SUNRISE*L*
3 series · 3 of 3 positions shown · non-contrast
Comparison: None.

CLINICAL DATA: Left knee pain. Anteromedial pain for 2 years. No
known injury.

EXAM:
LEFT KNEE 3 VIEWS

[knee ap]
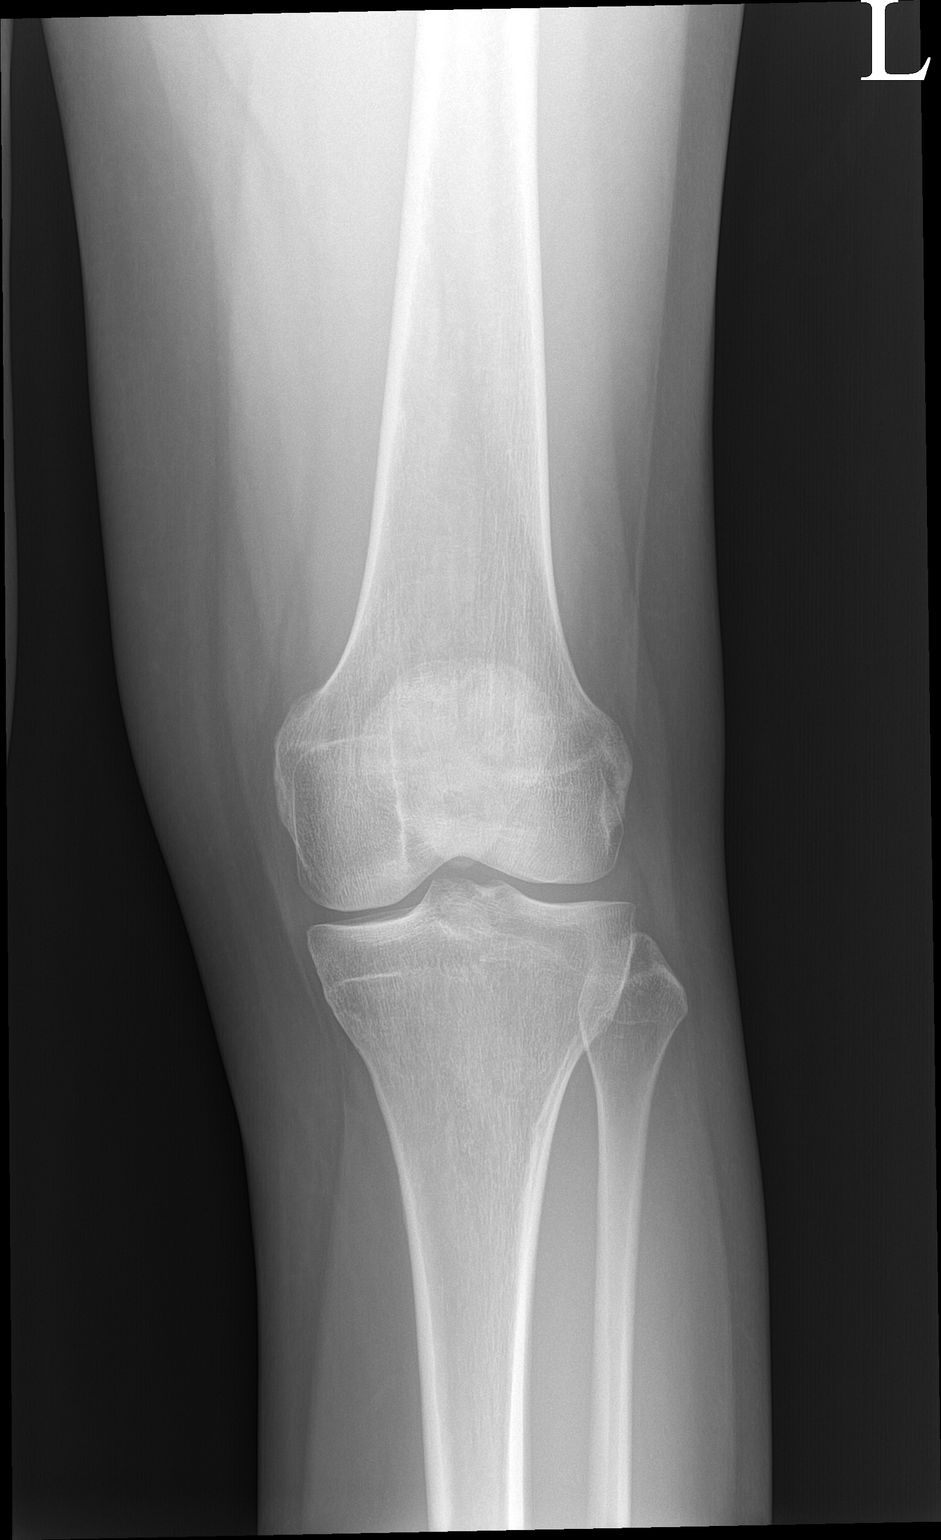

[knee lat]
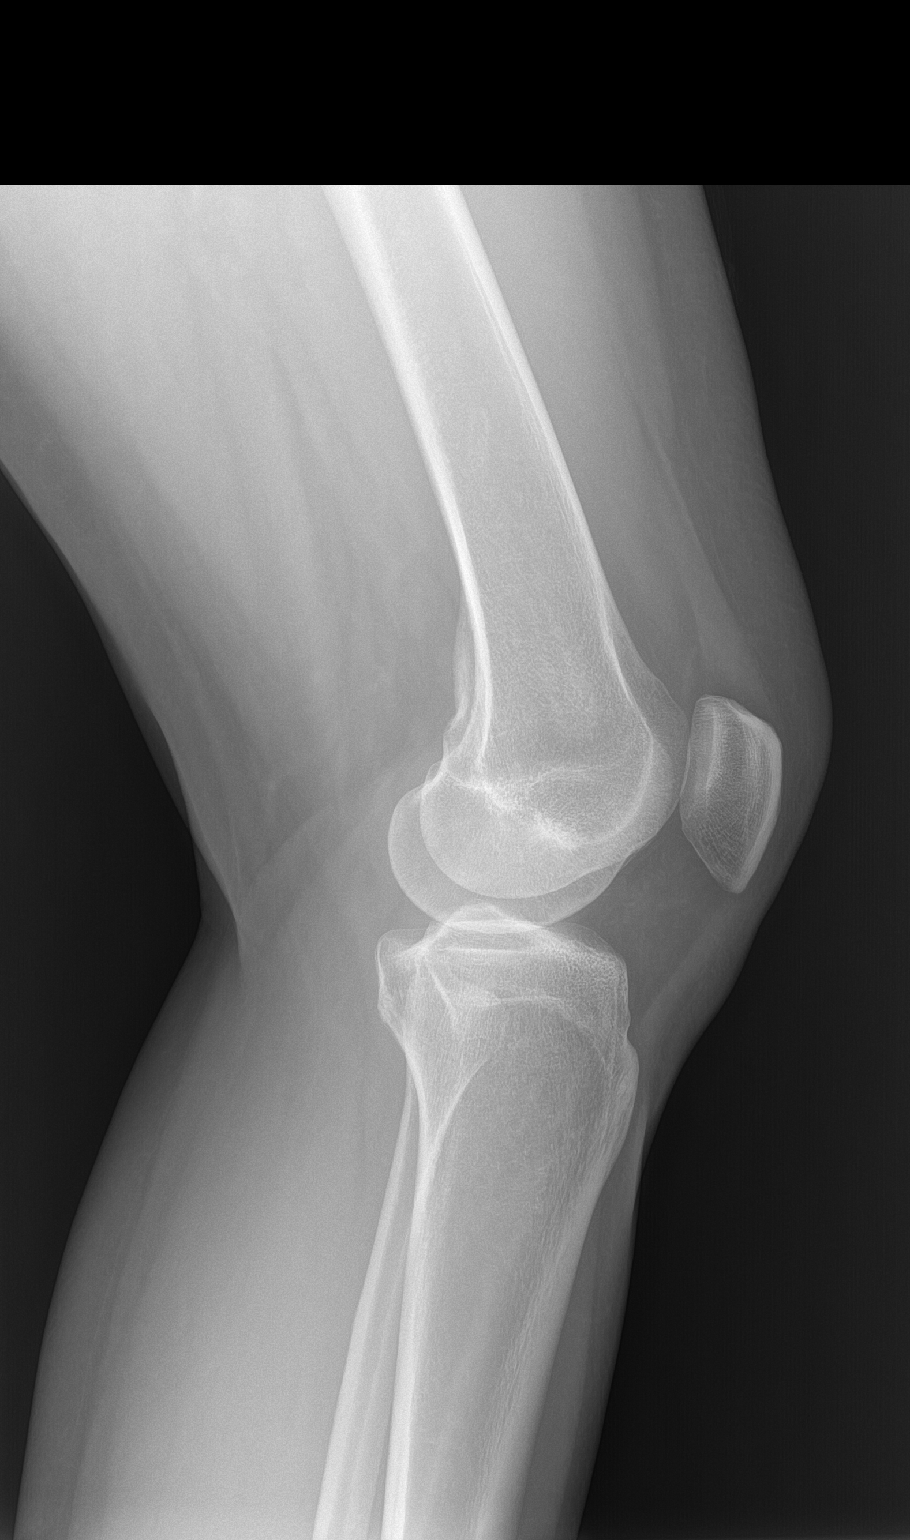

[patella]
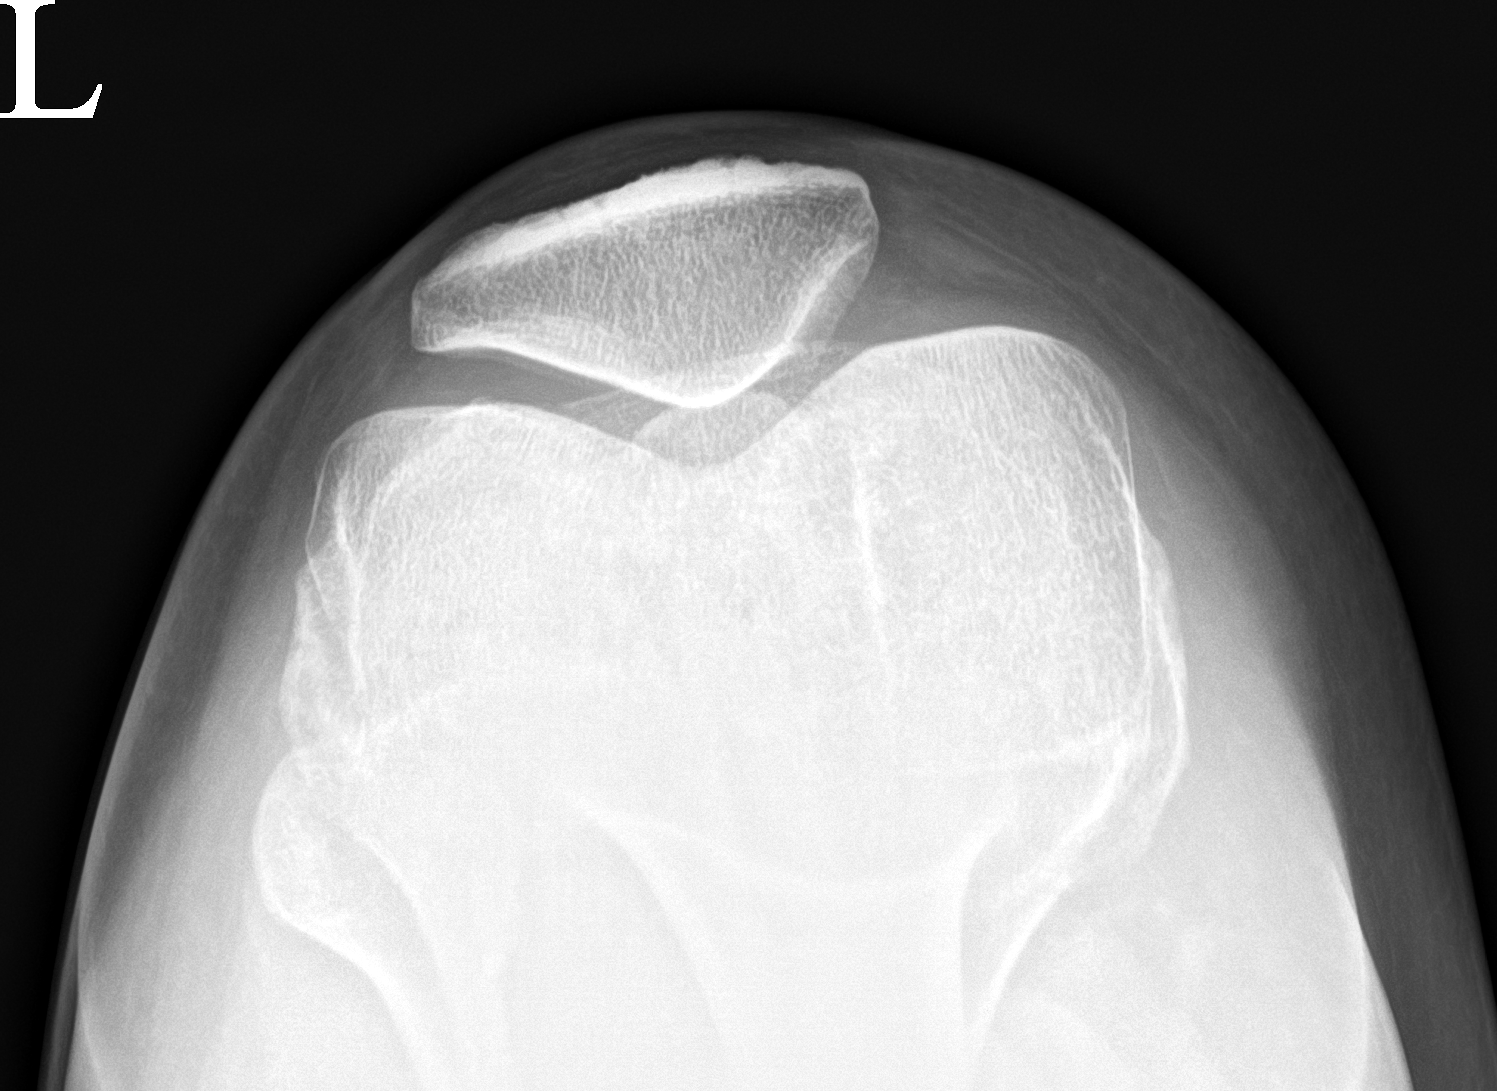

[3 of 3 positions shown; findings below may reference images not displayed]

FINDINGS: No evidence of fracture, dislocation, or joint effusion. Normal
joint spaces and alignment. Patella normally situated in the
trochlear groove. No evidence of arthropathy or other focal bone
abnormality. Soft tissues are unremarkable.
IMPRESSION: Negative radiographs of the left knee.

## 2023-08-07 IMAGING — DX DG LUMBAR SPINE 2-3V
3 series · 3 of 3 positions shown · non-contrast
Comparison: None.

CLINICAL DATA: Left leg pain

EXAM:
LUMBAR SPINE - 2-3 VIEW

[l-spine ap]
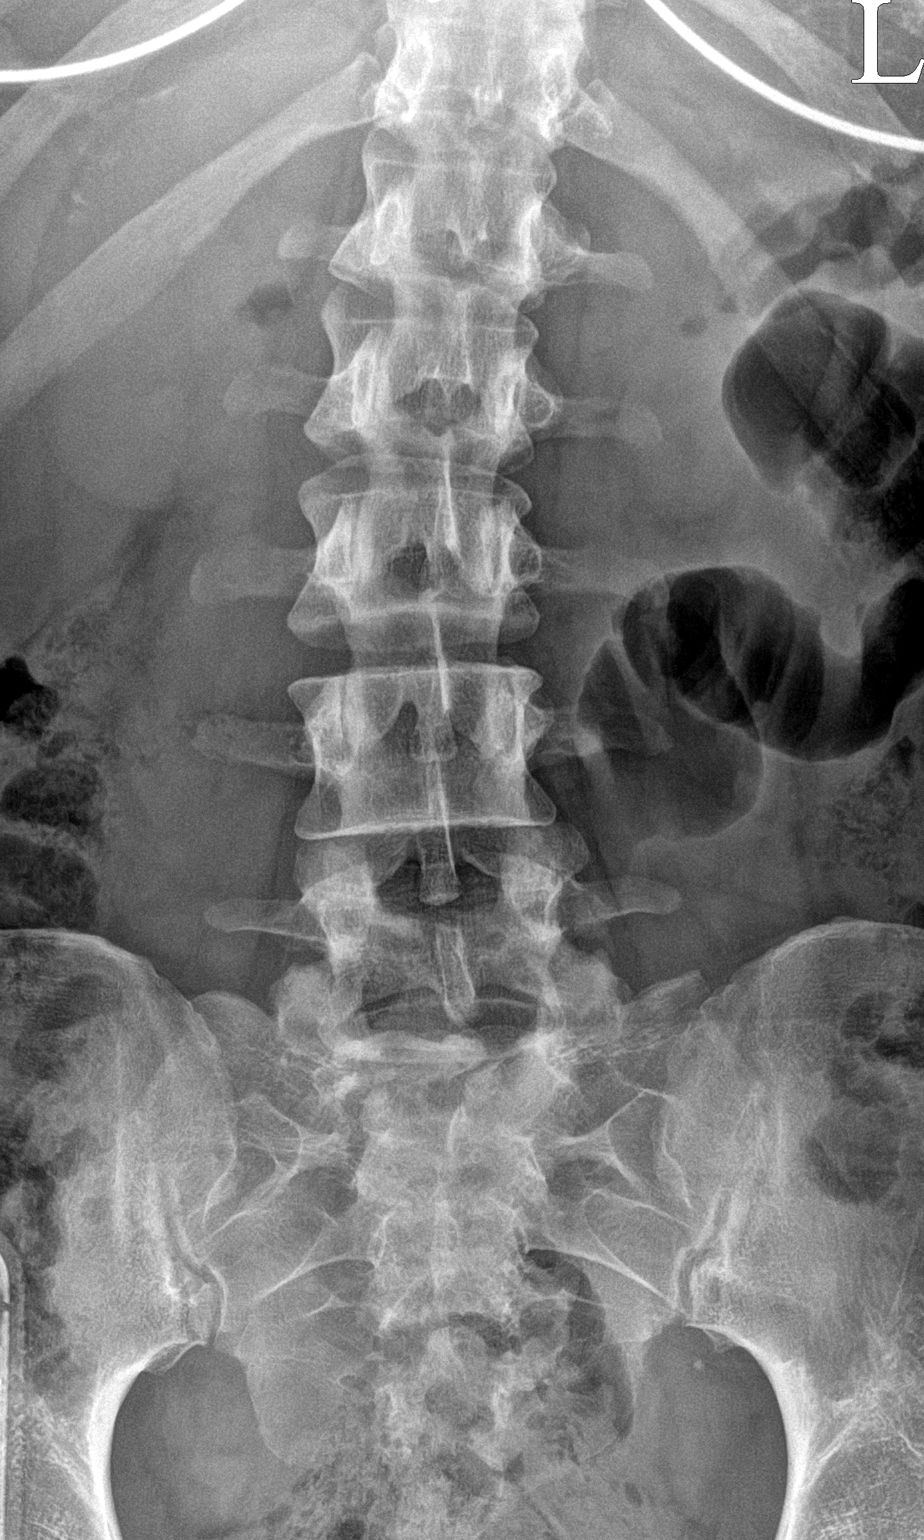

[l-spine lateral]
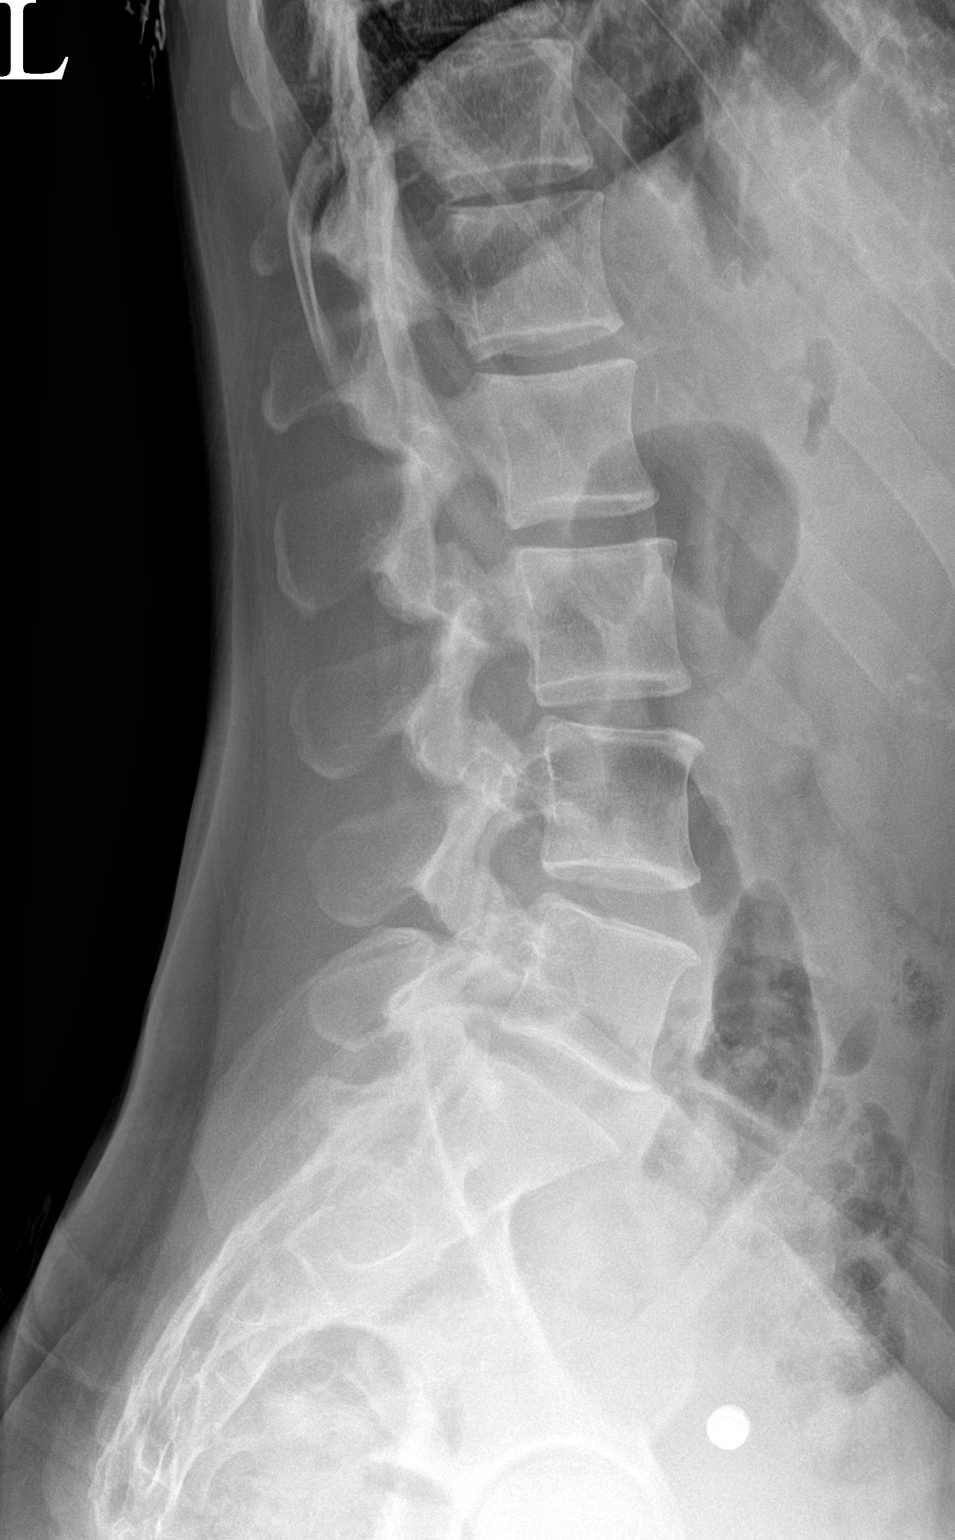

[l-spine spot]
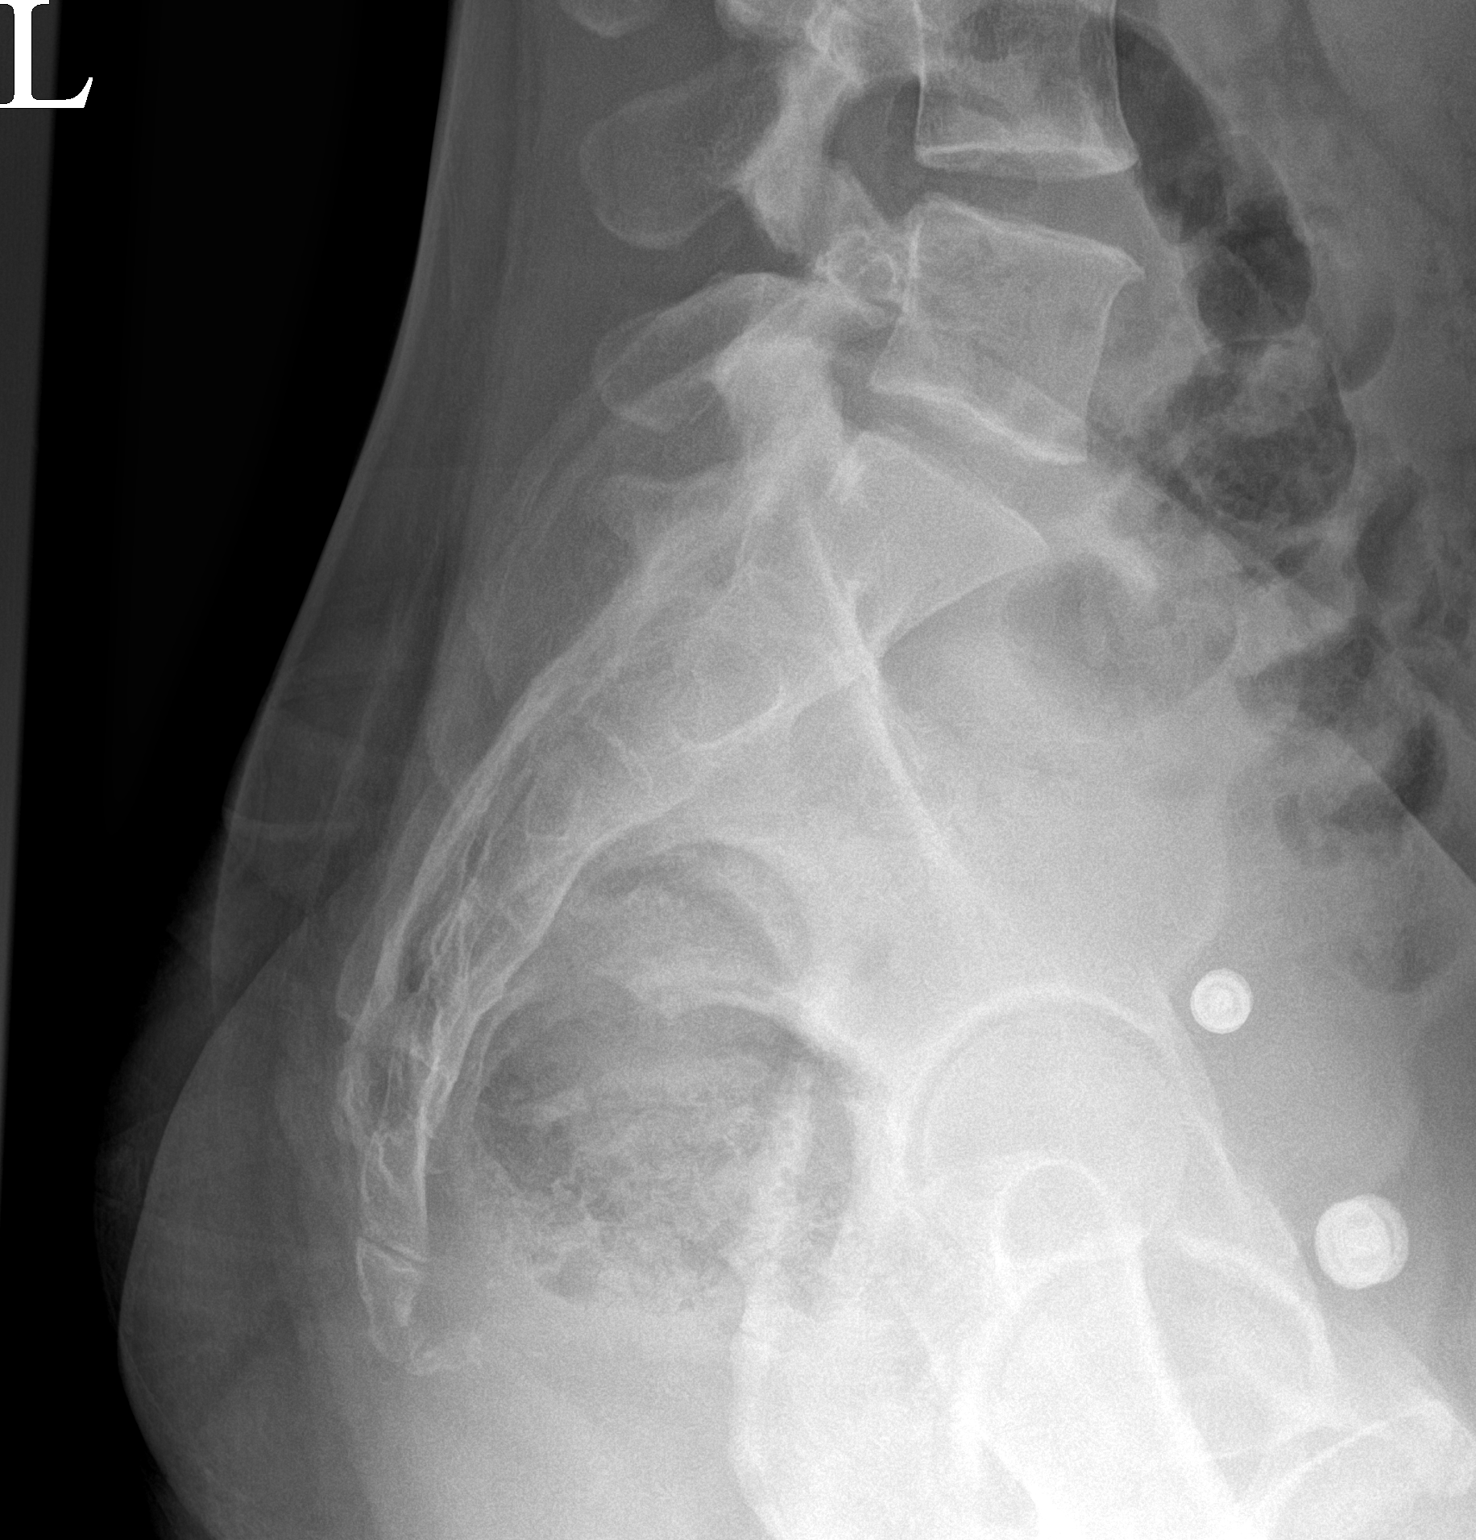

[3 of 3 positions shown; findings below may reference images not displayed]

FINDINGS: Mild dextroscoliosis. Sagittal alignment is normal. The disc spaces
are patent. The vertebral body heights are normal
IMPRESSION: Mild dextrocurvature.  Otherwise negative

## 2023-09-16 DIAGNOSIS — Z0189 Encounter for other specified special examinations: Secondary | ICD-10-CM | POA: Diagnosis not present

## 2023-09-16 DIAGNOSIS — R7309 Other abnormal glucose: Secondary | ICD-10-CM | POA: Diagnosis not present

## 2023-09-16 DIAGNOSIS — R7989 Other specified abnormal findings of blood chemistry: Secondary | ICD-10-CM | POA: Diagnosis not present

## 2023-09-23 DIAGNOSIS — K219 Gastro-esophageal reflux disease without esophagitis: Secondary | ICD-10-CM | POA: Diagnosis not present

## 2023-09-23 DIAGNOSIS — Z0000 Encounter for general adult medical examination without abnormal findings: Secondary | ICD-10-CM | POA: Diagnosis not present

## 2023-09-23 DIAGNOSIS — R140 Abdominal distension (gaseous): Secondary | ICD-10-CM | POA: Diagnosis not present

## 2023-09-23 DIAGNOSIS — R0989 Other specified symptoms and signs involving the circulatory and respiratory systems: Secondary | ICD-10-CM | POA: Diagnosis not present

## 2023-09-23 DIAGNOSIS — R1084 Generalized abdominal pain: Secondary | ICD-10-CM | POA: Diagnosis not present

## 2023-09-28 DIAGNOSIS — S99922A Unspecified injury of left foot, initial encounter: Secondary | ICD-10-CM | POA: Diagnosis not present

## 2023-09-28 DIAGNOSIS — M79672 Pain in left foot: Secondary | ICD-10-CM | POA: Diagnosis not present

## 2023-10-08 DIAGNOSIS — R1084 Generalized abdominal pain: Secondary | ICD-10-CM | POA: Diagnosis not present

## 2023-10-08 DIAGNOSIS — R748 Abnormal levels of other serum enzymes: Secondary | ICD-10-CM | POA: Diagnosis not present

## 2023-10-08 DIAGNOSIS — R0989 Other specified symptoms and signs involving the circulatory and respiratory systems: Secondary | ICD-10-CM | POA: Diagnosis not present

## 2023-10-08 DIAGNOSIS — N2889 Other specified disorders of kidney and ureter: Secondary | ICD-10-CM | POA: Diagnosis not present

## 2023-10-09 DIAGNOSIS — S9032XA Contusion of left foot, initial encounter: Secondary | ICD-10-CM | POA: Diagnosis not present

## 2023-11-27 DIAGNOSIS — Z3200 Encounter for pregnancy test, result unknown: Secondary | ICD-10-CM | POA: Diagnosis not present

## 2023-11-28 DIAGNOSIS — O09291 Supervision of pregnancy with other poor reproductive or obstetric history, first trimester: Secondary | ICD-10-CM | POA: Diagnosis not present

## 2023-11-29 ENCOUNTER — Encounter (HOSPITAL_COMMUNITY): Payer: Self-pay | Admitting: *Deleted

## 2023-11-29 ENCOUNTER — Inpatient Hospital Stay (HOSPITAL_COMMUNITY)

## 2023-11-29 ENCOUNTER — Inpatient Hospital Stay (HOSPITAL_COMMUNITY)
Admission: AD | Admit: 2023-11-29 | Discharge: 2023-11-30 | Disposition: A | Discharge status: Home / Self Care | Attending: Obstetrics & Gynecology | Admitting: Obstetrics & Gynecology

## 2023-11-29 DIAGNOSIS — O208 Other hemorrhage in early pregnancy: Secondary | ICD-10-CM | POA: Insufficient documentation

## 2023-11-29 DIAGNOSIS — Z3491 Encounter for supervision of normal pregnancy, unspecified, first trimester: Secondary | ICD-10-CM

## 2023-11-29 DIAGNOSIS — Z3A01 Less than 8 weeks gestation of pregnancy: Secondary | ICD-10-CM | POA: Diagnosis not present

## 2023-11-29 DIAGNOSIS — B9689 Other specified bacterial agents as the cause of diseases classified elsewhere: Secondary | ICD-10-CM | POA: Insufficient documentation

## 2023-11-29 DIAGNOSIS — O23591 Infection of other part of genital tract in pregnancy, first trimester: Secondary | ICD-10-CM | POA: Diagnosis not present

## 2023-11-29 DIAGNOSIS — O209 Hemorrhage in early pregnancy, unspecified: Secondary | ICD-10-CM | POA: Diagnosis not present

## 2023-11-29 DIAGNOSIS — O418X10 Other specified disorders of amniotic fluid and membranes, first trimester, not applicable or unspecified: Secondary | ICD-10-CM

## 2023-11-29 DIAGNOSIS — Z113 Encounter for screening for infections with a predominantly sexual mode of transmission: Secondary | ICD-10-CM | POA: Diagnosis not present

## 2023-11-29 DIAGNOSIS — N760 Acute vaginitis: Secondary | ICD-10-CM | POA: Diagnosis not present

## 2023-11-29 LAB — CBC
HCT: 34.5 % — ABNORMAL LOW (ref 36.0–46.0)
Hemoglobin: 11.6 g/dL — ABNORMAL LOW (ref 12.0–15.0)
MCH: 28.5 pg (ref 26.0–34.0)
MCHC: 33.6 g/dL (ref 30.0–36.0)
MCV: 84.8 fL (ref 80.0–100.0)
Platelets: 263 10*3/uL (ref 150–400)
RBC: 4.07 MIL/uL (ref 3.87–5.11)
RDW: 12.9 % (ref 11.5–15.5)
WBC: 8.3 10*3/uL (ref 4.0–10.5)
nRBC: 0 % (ref 0.0–0.2)

## 2023-11-29 LAB — URINALYSIS, ROUTINE W REFLEX MICROSCOPIC
Bacteria, UA: NONE SEEN
Bilirubin Urine: NEGATIVE
Glucose, UA: NEGATIVE mg/dL
Ketones, ur: NEGATIVE mg/dL
Leukocytes,Ua: NEGATIVE
Nitrite: NEGATIVE
Protein, ur: NEGATIVE mg/dL
Specific Gravity, Urine: 1.011 (ref 1.005–1.030)
pH: 6 (ref 5.0–8.0)

## 2023-11-29 LAB — WET PREP, GENITAL
Sperm: NONE SEEN
Trich, Wet Prep: NONE SEEN
WBC, Wet Prep HPF POC: <10 (ref <10)
Yeast Wet Prep HPF POC: NONE SEEN

## 2023-11-29 LAB — HCG, QUANTITATIVE, PREGNANCY: hCG, Beta Chain, Quant, S: 33621 m[IU]/mL — ABNORMAL HIGH (ref <5)

## 2023-11-29 LAB — ABO/RH: ABO/RH(D): A POS

## 2023-11-29 LAB — POCT PREGNANCY, URINE: Preg Test, Ur: POSITIVE — AB

## 2023-11-29 NOTE — MAU Note (Signed)
 Autumn Stephenson is a 33 y.o. at Unknown here in MAU reporting: some vaginal bleeding since around 4 pm. More than spotting but seems to be slowing down now. Some cramping off and on.    LMP: 10/15/2023 Onset of complaint: 1600 Pain score: 2/10 Vitals:   11/29/23 2128  BP: 110/70  Pulse: 81  Resp: 16  Temp: 98.6 F (37 C)  SpO2: 100%     FHT: na  Lab orders placed from triage: urine

## 2023-11-29 NOTE — MAU Provider Note (Signed)
 S [name] is a 33 y.o. [phone] patient who presents to MAU today with complaint of vaginal bleeding since ~ 1600 with some intermittent cramping. She is unsure of LMP since she was breast feeding. Her UPT here is positive .    O BP 110/70 (BP Location: Right Arm)   Pulse 81   Temp 98.6 F (37 C) (Oral)   Resp 16   Ht 5' 3 (1.6 m)   Wt 54.7 kg   LMP 10/15/2023   SpO2 100%   BMI 21.36 kg/m  Physical Exam Vitals and nursing note reviewed.  Constitutional:      General: She is not in acute distress.    Appearance: Normal appearance. She is normal weight. She is not ill-appearing.  HENT:     Head: Normocephalic.     Nose: Nose normal.     Mouth/Throat:     Mouth: Mucous membranes are moist.   Cardiovascular:     Rate and Rhythm: Normal rate.  Pulmonary:     Effort: Pulmonary effort is normal.  Abdominal:     Palpations: Abdomen is soft.   Musculoskeletal:        General: Normal range of motion.     Cervical back: Normal range of motion.   Skin:    General: Skin is warm.   Neurological:     Mental Status: She is alert and oriented to person, place, and time.   Psychiatric:        Mood and Affect: Mood normal.        Behavior: Behavior normal.     MDM  HIGH  Vaginal bleeding / abdominal cramping in early pregnacy CBC: unremarkable HCG Quant: 45,409 ABO: A Positive OB Ultrasound: Confirms IUP with cardiac activity and a small subchorionic hematoma  Vaginal Swabs: c/w bacterial vaginosis- RX sent at discharge UA: unremarkable   Differential diagnosis considered for 1st trimester vaginal bleeding includes but is not limited to: ectopic pregnancy, complete spontaneous abortion, incomplete abortion, missed abortion, threatened abortion, embryonic/fetal demise, cervical insufficiency, cervical or vaginal disorder    Orders Placed This Encounter  Procedures   Wet prep, genital    Standing Status:   Standing    Number of Occurrences:   1   US  OB  LESS THAN 14 WEEKS WITH OB TRANSVAGINAL    Standing Status:   Standing    Number of Occurrences:   1    Symptom/Reason for Exam:   Vaginal bleeding [[id number]]   Urinalysis, Routine w reflex microscopic -Urine, Clean Catch    Standing Status:   Standing    Number of Occurrences:   1    Specimen Source:   Urine, Clean Catch [76]   hCG, quantitative, pregnancy    Standing Status:   Standing    Number of Occurrences:   1   CBC    Standing Status:   Standing    Number of Occurrences:   1   Pregnancy, urine POC    Standing Status:   Standing    Number of Occurrences:   1   ABO/Rh    Standing Status:   Standing    Number of Occurrences:   1   Discharge patient Discharge disposition: 01-Home or Self Care; Discharge patient date: 11/30/2023    Standing Status:   Standing    Number of Occurrences:   1    Discharge disposition:   01-Home or Self Care [1]    Discharge patient date:   11/30/2023  Results for orders placed or performed during the hospital encounter of 11/29/23 (from the past 24 hours)  Pregnancy, urine POC     Status: Abnormal   Collection Time: 11/29/23  9:12 PM  Result Value Ref Range   Preg Test, Ur POSITIVE (A) NEGATIVE  Urinalysis, Routine w reflex microscopic -Urine, Clean Catch     Status: Abnormal   Collection Time: 11/29/23  9:34 PM  Result Value Ref Range   Color, Urine YELLOW YELLOW   APPearance CLEAR CLEAR   Specific Gravity, Urine 1.011 1.005 - 1.030   pH 6.0 5.0 - 8.0   Glucose, UA NEGATIVE NEGATIVE mg/dL   Hgb urine dipstick MODERATE (A) NEGATIVE   Bilirubin Urine NEGATIVE NEGATIVE   Ketones, ur NEGATIVE NEGATIVE mg/dL   Protein, ur NEGATIVE NEGATIVE mg/dL   Nitrite NEGATIVE NEGATIVE   Leukocytes,Ua NEGATIVE NEGATIVE   RBC / HPF 0-5 0 - 5 RBC/hpf   WBC, UA 0-5 0 - 5 WBC/hpf   Bacteria, UA NONE SEEN NONE SEEN   Squamous Epithelial / HPF 0-5 0 - 5 /HPF  Wet prep, genital     Status: Abnormal   Collection Time: 11/29/23  9:54 PM  Result Value Ref  Range   Yeast Wet Prep HPF POC NONE SEEN NONE SEEN   Trich, Wet Prep NONE SEEN NONE SEEN   Clue Cells Wet Prep HPF POC PRESENT (A) NONE SEEN   WBC, Wet Prep HPF POC <10 <10   Sperm NONE SEEN   ABO/Rh     Status: None   Collection Time: 11/29/23  9:57 PM  Result Value Ref Range   ABO/RH(D) A POS    No rh immune globuloin      NOT A RH IMMUNE GLOBULIN CANDIDATE, PT RH POSITIVE Performed at [facility], [address] [place], [place] [address]   hCG, quantitative, pregnancy     Status: Abnormal   Collection Time: 11/29/23  9:59 PM  Result Value Ref Range   hCG, Beta Chain, Quant, S 33,621 (H) <5 mIU/mL  CBC     Status: Abnormal   Collection Time: 11/29/23  9:59 PM  Result Value Ref Range   WBC 8.3 4.0 - 10.5 K/uL   RBC 4.07 3.87 - 5.11 MIL/uL   Hemoglobin 11.6 (L) 12.0 - 15.0 g/dL   HCT 19.1 (L) 47.8 - 29.5 %   MCV 84.8 80.0 - 100.0 fL   MCH 28.5 26.0 - 34.0 pg   MCHC 33.6 30.0 - 36.0 g/dL   RDW 62.1 30.8 - 65.7 %   Platelets 263 150 - 400 K/uL   nRBC 0.0 0.0 - 0.2 %    Narrative & Impression  EXAM: ULTRASOUND FIRST TRIMESTER   CLINICAL HISTORY: Vaginal bleeding   TECHNIQUE: Transabdominal and transvaginal first trimester obstetric pelvic duplex ultrasound was performed with real-time imaging, color flow Doppler imaging, and spectral analysis.   COMPARISON: None provided.   FINDINGS:   UTERUS: No focal myometrial mass.   GESTATIONAL SAC(S): Single normal appearing gestational sac. Small subchorionic hemorrhage.   YOLK SAC: Present   EMBRYO(<11WK) /FETUS(>=11WK): Single embryo   CROWN RUMP LENGTH: 2.1 mm (5 weeks 5 days)   RATE OF CARDIAC ACTIVITY: Fetal heart rate 128 beats per minute.   RIGHT OVARY: Unremarkable. Normal arterial and venous flow.   LEFT OVARY: Unremarkable. Normal arterial and venous flow.   FREE FLUID: No free fluid.   MEASUREMENTS   ESTIMATED GESTATIONAL AGE BY CURRENT ULTRASOUND: 5 weeks 5 days  ESTIMATED DUE  DATE: 07/26/2024   IMPRESSION: 1. Single intrauterine gestation with cardiac activity, measuring 5 weeks 5 days by CRL, as above.   Electronically signed by: Zadie Herter MD 11/29/2023 10:52 PM EDT RP Workstation: [id number]     I have reviewed the patient chart and performed the physical exam . I have ordered & interpreted the lab results and reviewed and interpreted the ultrasound images and agree with the radiologist findings Medications ordered as stated below.  A/P as described below.  Counseling and education provided and patient agreeable  with plan as described below. Verbalized understanding.    ASSESSMENT Medical screening exam complete  1. Bacterial vaginosis  2. Normal intrauterine pregnancy on prenatal ultrasound in first trimester  3. Vaginal bleeding affecting early pregnancy (Primary)  4. Subchorionic hematoma in first trimester, single or unspecified fetus     PLAN  Allergies as of 11/30/2023   No Known Allergies      Medication List     STOP taking these medications    cephALEXin  500 MG capsule Commonly known as: KEFLEX    dicyclomine  20 MG tablet Commonly known as: BENTYL    Slynd  4 MG Tabs Generic drug: Drospirenone        TAKE these medications    metroNIDAZOLE 500 MG tablet Commonly known as: FLAGYL Take 1 tablet (500 mg total) by mouth 2 (two) times daily.   ondansetron  4 MG tablet Commonly known as: Zofran  Take 2 tablets (8 mg total) by mouth 2 (two) times daily.   prenatal multivitamin Tabs tablet Take 1 tablet by mouth daily at 12 noon.         Establish Prenatal Care   Discharge from MAU in stable condition  See AVS for full description of educational information and instructions provided to the patient at time of discharge  List of options for follow-up given   Warning signs for worsening condition that would warrant emergency follow-up discussed Patient may return to MAU as needed   Cherlynn Cornfield,  NP 11/30/2023 2:34 AM

## 2023-11-30 DIAGNOSIS — B9689 Other specified bacterial agents as the cause of diseases classified elsewhere: Secondary | ICD-10-CM | POA: Diagnosis not present

## 2023-11-30 DIAGNOSIS — Z3A01 Less than 8 weeks gestation of pregnancy: Secondary | ICD-10-CM | POA: Diagnosis not present

## 2023-11-30 DIAGNOSIS — O209 Hemorrhage in early pregnancy, unspecified: Secondary | ICD-10-CM

## 2023-11-30 DIAGNOSIS — N760 Acute vaginitis: Secondary | ICD-10-CM | POA: Diagnosis not present

## 2023-11-30 MED ORDER — ONDANSETRON HCL 4 MG PO TABS: 8.0000 mg | ORAL_TABLET | Freq: Two times a day (BID) | ORAL | 0 refills | Status: DC

## 2023-11-30 MED ORDER — METRONIDAZOLE 500 MG PO TABS: 500.0000 mg | ORAL_TABLET | Freq: Two times a day (BID) | ORAL | 0 refills | Status: DC

## 2023-11-30 NOTE — Discharge Instructions (Signed)
 Schedule New OB and Prenatal Care

## 2023-12-01 LAB — GC/CHLAMYDIA PROBE AMP (~~LOC~~) NOT AT ARMC
Chlamydia: NEGATIVE
Comment: NEGATIVE
Comment: NORMAL
Neisseria Gonorrhea: NEGATIVE

## 2023-12-12 DIAGNOSIS — Z3201 Encounter for pregnancy test, result positive: Secondary | ICD-10-CM | POA: Diagnosis not present

## 2023-12-24 DIAGNOSIS — O209 Hemorrhage in early pregnancy, unspecified: Secondary | ICD-10-CM | POA: Diagnosis not present

## 2023-12-24 DIAGNOSIS — Z3A09 9 weeks gestation of pregnancy: Secondary | ICD-10-CM | POA: Diagnosis not present

## 2024-01-02 DIAGNOSIS — Z1331 Encounter for screening for depression: Secondary | ICD-10-CM | POA: Diagnosis not present

## 2024-01-02 DIAGNOSIS — Z118 Encounter for screening for other infectious and parasitic diseases: Secondary | ICD-10-CM | POA: Diagnosis not present

## 2024-01-02 DIAGNOSIS — Z3689 Encounter for other specified antenatal screening: Secondary | ICD-10-CM | POA: Diagnosis not present

## 2024-01-02 DIAGNOSIS — Z3481 Encounter for supervision of other normal pregnancy, first trimester: Secondary | ICD-10-CM | POA: Diagnosis not present

## 2024-01-02 DIAGNOSIS — R768 Other specified abnormal immunological findings in serum: Secondary | ICD-10-CM | POA: Diagnosis not present

## 2024-01-02 LAB — OB RESULTS CONSOLE RUBELLA ANTIBODY, IGM: Rubella: IMMUNE

## 2024-01-02 LAB — OB RESULTS CONSOLE HEPATITIS B SURFACE ANTIGEN: Hepatitis B Surface Ag: NEGATIVE

## 2024-01-02 LAB — OB RESULTS CONSOLE GC/CHLAMYDIA
Chlamydia: NEGATIVE
Neisseria Gonorrhea: NEGATIVE

## 2024-01-02 LAB — OB RESULTS CONSOLE RPR: RPR: NONREACTIVE

## 2024-01-02 LAB — OB RESULTS CONSOLE HIV ANTIBODY (ROUTINE TESTING): HIV: NONREACTIVE

## 2024-01-06 DIAGNOSIS — R768 Other specified abnormal immunological findings in serum: Secondary | ICD-10-CM | POA: Diagnosis not present

## 2024-02-04 DIAGNOSIS — Z3A15 15 weeks gestation of pregnancy: Secondary | ICD-10-CM | POA: Diagnosis not present

## 2024-02-04 DIAGNOSIS — O09292 Supervision of pregnancy with other poor reproductive or obstetric history, second trimester: Secondary | ICD-10-CM | POA: Diagnosis not present

## 2024-02-04 DIAGNOSIS — O34219 Maternal care for unspecified type scar from previous cesarean delivery: Secondary | ICD-10-CM | POA: Diagnosis not present

## 2024-02-04 DIAGNOSIS — Z361 Encounter for antenatal screening for raised alphafetoprotein level: Secondary | ICD-10-CM | POA: Diagnosis not present

## 2024-02-10 ENCOUNTER — Encounter: Payer: Self-pay | Admitting: Certified Nurse Midwife

## 2024-02-10 DIAGNOSIS — Z361 Encounter for antenatal screening for raised alphafetoprotein level: Secondary | ICD-10-CM | POA: Diagnosis not present

## 2024-03-04 DIAGNOSIS — O34219 Maternal care for unspecified type scar from previous cesarean delivery: Secondary | ICD-10-CM | POA: Diagnosis not present

## 2024-03-04 DIAGNOSIS — Z3A19 19 weeks gestation of pregnancy: Secondary | ICD-10-CM | POA: Diagnosis not present

## 2024-03-04 DIAGNOSIS — O09292 Supervision of pregnancy with other poor reproductive or obstetric history, second trimester: Secondary | ICD-10-CM | POA: Diagnosis not present

## 2024-03-04 DIAGNOSIS — Z363 Encounter for antenatal screening for malformations: Secondary | ICD-10-CM | POA: Diagnosis not present

## 2024-03-23 DIAGNOSIS — R7689 Other specified abnormal immunological findings in serum: Secondary | ICD-10-CM | POA: Diagnosis not present

## 2024-04-07 DIAGNOSIS — Z23 Encounter for immunization: Secondary | ICD-10-CM | POA: Diagnosis not present

## 2024-05-04 DIAGNOSIS — Z1331 Encounter for screening for depression: Secondary | ICD-10-CM | POA: Diagnosis not present

## 2024-05-25 DIAGNOSIS — Z23 Encounter for immunization: Secondary | ICD-10-CM | POA: Diagnosis not present

## 2024-06-08 ENCOUNTER — Other Ambulatory Visit: Payer: Self-pay | Admitting: Obstetrics & Gynecology

## 2024-07-09 ENCOUNTER — Encounter (HOSPITAL_COMMUNITY): Payer: Self-pay

## 2024-07-09 NOTE — Patient Instructions (Signed)
"   Autumn Stephenson  07/09/2024   Your procedure is scheduled on:  07/22/2024  Arrive at 1130 at [facility] on [address] at [facility]  and [address]. You are invited to use the FREE valet parking or use the Visitor's parking deck.  Pick up the phone at the desk and dial [phone].  Call this number if you have problems the morning of surgery: [phone]  Remember:   Do not eat food:(After Midnight) Desps de medianoche.  You may drink clear liquids until  ___0930__.  Clear liquids means a liquid you can see thru.  It can have color such as Cola or Kool aid.  Tea is OK and coffee as long as no milk or creamer of any kind.  Take these medicines the morning of surgery with A SIP OF WATER :  pantoprazole    Do not wear jewelry, make-up or nail polish.  Do not wear lotions, powders, or perfumes. Do not wear deodorant.  Do not shave 48 hours prior to surgery.  Do not bring valuables to the hospital.  [facility] is not   responsible for any belongings or valuables brought to the hospital.  Contacts, dentures or bridgework may not be worn into surgery.  Leave suitcase in the car. After surgery it may be brought to your room.  For patients admitted to the hospital, checkout time is 11:00 AM the day of              discharge.      Please read over the following fact sheets that you were given:     Preparing for Surgery   "

## 2024-07-20 ENCOUNTER — Encounter (HOSPITAL_COMMUNITY)
Admission: RE | Admit: 2024-07-20 | Discharge: 2024-07-20 | Disposition: A | Source: Ambulatory Visit | Attending: Obstetrics & Gynecology | Admitting: Obstetrics & Gynecology

## 2024-07-20 DIAGNOSIS — Z3A39 39 weeks gestation of pregnancy: Secondary | ICD-10-CM | POA: Insufficient documentation

## 2024-07-20 DIAGNOSIS — O34219 Maternal care for unspecified type scar from previous cesarean delivery: Secondary | ICD-10-CM | POA: Insufficient documentation

## 2024-07-20 HISTORY — DX: Supervision of pregnancy with other poor reproductive or obstetric history, unspecified trimester: O09.299

## 2024-07-20 LAB — CBC
HCT: 33 % — ABNORMAL LOW (ref 36.0–46.0)
Hemoglobin: 10.5 g/dL — ABNORMAL LOW (ref 12.0–15.0)
MCH: 25.9 pg — ABNORMAL LOW (ref 26.0–34.0)
MCHC: 31.8 g/dL (ref 30.0–36.0)
MCV: 81.3 fL (ref 80.0–100.0)
Platelets: 230 10*3/uL (ref 150–400)
RBC: 4.06 MIL/uL (ref 3.87–5.11)
RDW: 13.5 % (ref 11.5–15.5)
WBC: 8.2 10*3/uL (ref 4.0–10.5)
nRBC: 0 % (ref 0.0–0.2)

## 2024-07-20 LAB — SYPHILIS: RPR W/REFLEX TO RPR TITER AND TREPONEMAL ANTIBODIES, TRADITIONAL SCREENING AND DIAGNOSIS ALGORITHM: RPR Ser Ql: NONREACTIVE

## 2024-07-21 NOTE — H&P (Incomplete)
 Autumn Stephenson is a 34 y.o. female presenting for repeat Csection  Married, G3P1011, G1- Blighted ovum, D&C. G2- term C/section Sept'23 for unstable lie, baby became breech again in labor after version. 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 risk of recurrence and was advised against TOLAC by that Ob.  No LEEP/ no HSV. Used to take Serequel to sleep, stopped before last pregnancy. HepC Ab pos but RNA neg. pt has  seen GI in past- Autumn Stephenson.  Saw again this preg, no concerns. No infection per GI- she has false pos HepC antibodies. no past infection H/o rash and itching pp last pregnancy, poss pp PUPPs. CMP, bile acid nl this preg  32.6 wk growth  EFW 4'4 21% AC 34% Vx anterior placenta, no abn blood flow. AFI nl.     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, currently pregnant    Syncope    Past Surgical History:  Procedure Laterality Date   CESAREAN SECTION N/A 02/23/2022   Procedure: CESAREAN SECTION;  Surgeon: Marilynn Nest, DO;  Location: [facility] LD ORS;  Service: Obstetrics;  Laterality: N/A;   DILATION AND EVACUATION N/A 09/12/2020   Procedure: DILATATION AND EVACUATION;  Surgeon: Gloriann Chick, MD;  Location: [facility] OR;  Service: Gynecology;  Laterality: N/A;   ORIF CLAVICULAR FRACTURE     TONSILLECTOMY  06/17/1997   Family History: family history includes Alzheimer's disease in her paternal grandmother; Cancer in her maternal grandmother and mother; Diabetes in her father. Social History:  reports that she quit smoking about 3 years ago. Her smoking use included cigarettes. She started smoking about 13 years ago. She has a 5 pack-year smoking history. She has never used  smokeless tobacco. She reports current alcohol use of about 6.0 standard drinks of alcohol per week. She reports that she does not use drugs.     Maternal Diabetes: {Maternal Diabetes:3043596} Genetic Screening: {Genetic Screening:20205} Maternal Ultrasounds/Referrals: {Maternal Ultrasounds / Referrals:20211} Fetal Ultrasounds or other Referrals:  {Fetal Ultrasounds or Other Referrals:20213} Maternal Substance Abuse:  {Maternal Substance Abuse:20223} Significant Maternal Medications:  {Significant Maternal Meds:20233} Significant Maternal Lab Results:  {Significant Maternal Lab Results:20235} Number of Prenatal Visits:{Prenatal Visits:27860} Maternal Vaccinations:{Maternal Immunizations:31012} Other Comments:  {Other Comments:20251}  Review of Systems History   Last menstrual period 10/15/2023, currently breastfeeding. Exam Physical Exam  Prenatal labs: ABO, Rh: --/--/A POS (02/03 1015) Antibody: NEG (02/03 1015) Rubella: Immune (07/18 0000) RPR: NON REACTIVE (02/03 1010)  HBsAg: Negative (07/18 0000)  HIV: Non-reactive (07/18 0000)  GBS:     Assessment/Plan: ***   Autumn Stephenson 07/21/2024, 9:00 PM

## 2024-07-22 ENCOUNTER — Encounter (HOSPITAL_COMMUNITY): Payer: Self-pay | Admitting: Anesthesiology

## 2024-07-22 ENCOUNTER — Other Ambulatory Visit: Payer: Self-pay

## 2024-07-22 ENCOUNTER — Inpatient Hospital Stay (HOSPITAL_COMMUNITY): Admission: RE | Admit: 2024-07-22 | Source: Home / Self Care | Admitting: Obstetrics & Gynecology

## 2024-07-22 ENCOUNTER — Encounter (HOSPITAL_COMMUNITY): Admission: RE | Payer: Self-pay | Source: Home / Self Care

## 2024-07-22 ENCOUNTER — Encounter (HOSPITAL_COMMUNITY): Payer: Self-pay | Admitting: Obstetrics & Gynecology

## 2024-07-22 DIAGNOSIS — O34219 Maternal care for unspecified type scar from previous cesarean delivery: Principal | ICD-10-CM | POA: Diagnosis present

## 2024-07-22 DIAGNOSIS — Z98891 History of uterine scar from previous surgery: Secondary | ICD-10-CM

## 2024-07-22 LAB — TYPE AND SCREEN
ABO/RH(D): A POS
Antibody Screen: NEGATIVE
Unit division: 0
Unit division: 0

## 2024-07-22 LAB — BPAM RBC
Blood Product Expiration Date: 202603012359
Blood Product Expiration Date: 202603012359
Unit Type and Rh: 6200
Unit Type and Rh: 6200

## 2024-07-22 LAB — PREPARE RBC (CROSSMATCH)

## 2024-07-22 MED ORDER — PHENYLEPHRINE HCL-NACL 20-0.9 MG/250ML-% IV SOLN
INTRAVENOUS | Status: AC
  Filled 2024-07-22: qty 250

## 2024-07-22 MED ORDER — NALOXONE HCL 4 MG/10ML IJ SOLN: 1.0000 ug/kg/h | INTRAVENOUS | Status: AC | PRN

## 2024-07-22 MED ORDER — SCOPOLAMINE 1 MG/3DAYS TD PT72
1.0000 | MEDICATED_PATCH | TRANSDERMAL | Status: DC
  Administered 2024-07-22: 1 mg via TRANSDERMAL

## 2024-07-22 MED ORDER — OXYTOCIN-SODIUM CHLORIDE 30-0.9 UT/500ML-% IV SOLN: 2.5000 [IU]/h | INTRAVENOUS | Status: AC

## 2024-07-22 MED ORDER — MEPERIDINE HCL 25 MG/ML IJ SOLN: 6.2500 mg | INTRAMUSCULAR | Status: DC | PRN

## 2024-07-22 MED ORDER — DROPERIDOL 2.5 MG/ML IJ SOLN: 0.6250 mg | Freq: Once | INTRAMUSCULAR | Status: DC | PRN

## 2024-07-22 MED ORDER — NALOXONE HCL 0.4 MG/ML IJ SOLN: 0.4000 mg | INTRAMUSCULAR | Status: AC | PRN

## 2024-07-22 MED ORDER — SIMETHICONE 80 MG PO CHEW: 80.0000 mg | CHEWABLE_TABLET | ORAL | Status: AC | PRN

## 2024-07-22 MED ORDER — DIBUCAINE (PERIANAL) 1 % EX OINT: 1.0000 | TOPICAL_OINTMENT | CUTANEOUS | Status: AC | PRN

## 2024-07-22 MED ORDER — KETOROLAC TROMETHAMINE 30 MG/ML IJ SOLN
INTRAMUSCULAR | Status: AC
  Filled 2024-07-22: qty 1

## 2024-07-22 MED ORDER — KETOROLAC TROMETHAMINE 30 MG/ML IJ SOLN
30.0000 mg | Freq: Four times a day (QID) | INTRAMUSCULAR | Status: AC | PRN
  Administered 2024-07-22: 30 mg via INTRAMUSCULAR

## 2024-07-22 MED ORDER — METHYLERGONOVINE MALEATE 0.2 MG/ML IJ SOLN
INTRAMUSCULAR | Status: AC
  Filled 2024-07-22: qty 1

## 2024-07-22 MED ORDER — MORPHINE SULFATE (PF) 0.5 MG/ML IJ SOLN
INTRAMUSCULAR | Status: AC
  Filled 2024-07-22: qty 10

## 2024-07-22 MED ORDER — OXYCODONE HCL 5 MG PO TABS: 5.0000 mg | ORAL_TABLET | Freq: Once | ORAL | Status: DC | PRN

## 2024-07-22 MED ORDER — DIPHENHYDRAMINE HCL 25 MG PO CAPS: 25.0000 mg | ORAL_CAPSULE | Freq: Four times a day (QID) | ORAL | Status: AC | PRN

## 2024-07-22 MED ORDER — DEXAMETHASONE SOD PHOSPHATE PF 10 MG/ML IJ SOLN
INTRAMUSCULAR | Status: DC | PRN
  Administered 2024-07-22: 10 mg via INTRAVENOUS

## 2024-07-22 MED ORDER — DIPHENHYDRAMINE HCL 50 MG/ML IJ SOLN
INTRAMUSCULAR | Status: AC
  Filled 2024-07-22: qty 1

## 2024-07-22 MED ORDER — PHENYLEPHRINE HCL-NACL 20-0.9 MG/250ML-% IV SOLN
INTRAVENOUS | Status: DC | PRN
  Administered 2024-07-22: 60 ug/min via INTRAVENOUS

## 2024-07-22 MED ORDER — CEFAZOLIN SODIUM-DEXTROSE 2-4 GM/100ML-% IV SOLN
INTRAVENOUS | Status: AC
  Filled 2024-07-22: qty 100

## 2024-07-22 MED ORDER — OXYCODONE HCL 5 MG/5ML PO SOLN: 5.0000 mg | Freq: Once | ORAL | Status: DC | PRN

## 2024-07-22 MED ORDER — IBUPROFEN 600 MG PO TABS
600.0000 mg | ORAL_TABLET | Freq: Four times a day (QID) | ORAL | Status: AC
  Administered 2024-07-23 (×2): 600 mg via ORAL
  Filled 2024-07-22 (×2): qty 1

## 2024-07-22 MED ORDER — ONDANSETRON HCL 4 MG/2ML IJ SOLN
INTRAMUSCULAR | Status: AC
  Filled 2024-07-22: qty 2

## 2024-07-22 MED ORDER — KETOROLAC TROMETHAMINE 30 MG/ML IJ SOLN: 30.0000 mg | Freq: Four times a day (QID) | INTRAMUSCULAR | Status: AC | PRN

## 2024-07-22 MED ORDER — SODIUM CHLORIDE 0.9% FLUSH: 3.0000 mL | INTRAVENOUS | Status: AC | PRN

## 2024-07-22 MED ORDER — SENNOSIDES-DOCUSATE SODIUM 8.6-50 MG PO TABS
2.0000 | ORAL_TABLET | Freq: Every day | ORAL | Status: AC
  Administered 2024-07-23: 2 via ORAL
  Filled 2024-07-22: qty 2

## 2024-07-22 MED ORDER — SODIUM CHLORIDE 0.9 % IR SOLN
Status: DC | PRN
  Administered 2024-07-22: 1

## 2024-07-22 MED ORDER — MENTHOL 3 MG MT LOZG: 1.0000 | LOZENGE | OROMUCOSAL | Status: AC | PRN

## 2024-07-22 MED ORDER — SCOPOLAMINE 1 MG/3DAYS TD PT72
1.0000 | MEDICATED_PATCH | Freq: Once | TRANSDERMAL | Status: AC
  Filled 2024-07-22: qty 1

## 2024-07-22 MED ORDER — ACETAMINOPHEN 10 MG/ML IV SOLN
INTRAVENOUS | Status: DC | PRN
  Administered 2024-07-22: 1000 mg via INTRAVENOUS

## 2024-07-22 MED ORDER — SODIUM CHLORIDE 0.9% IV SOLUTION: Freq: Once | INTRAVENOUS | Status: DC

## 2024-07-22 MED ORDER — COCONUT OIL OIL: 1.0000 | TOPICAL_OIL | Status: AC | PRN

## 2024-07-22 MED ORDER — PRENATAL MULTIVITAMIN CH
1.0000 | ORAL_TABLET | Freq: Every day | ORAL | Status: AC
  Administered 2024-07-23: 1 via ORAL
  Filled 2024-07-22: qty 1

## 2024-07-22 MED ORDER — DEXAMETHASONE SODIUM PHOSPHATE 4 MG/ML IJ SOLN
INTRAMUSCULAR | Status: AC
  Filled 2024-07-22: qty 2

## 2024-07-22 MED ORDER — ONDANSETRON HCL 4 MG/2ML IJ SOLN
INTRAMUSCULAR | Status: DC | PRN
  Administered 2024-07-22: 4 mg via INTRAVENOUS

## 2024-07-22 MED ORDER — ZOLPIDEM TARTRATE 5 MG PO TABS: 5.0000 mg | ORAL_TABLET | Freq: Every evening | ORAL | Status: AC | PRN

## 2024-07-22 MED ORDER — FENTANYL CITRATE (PF) 100 MCG/2ML IJ SOLN
INTRAMUSCULAR | Status: AC
  Filled 2024-07-22: qty 2

## 2024-07-22 MED ORDER — PANTOPRAZOLE SODIUM 40 MG PO TBEC
40.0000 mg | DELAYED_RELEASE_TABLET | Freq: Every day | ORAL | Status: AC
  Administered 2024-07-23: 40 mg via ORAL
  Filled 2024-07-22: qty 1

## 2024-07-22 MED ORDER — DIPHENHYDRAMINE HCL 25 MG PO CAPS: 25.0000 mg | ORAL_CAPSULE | ORAL | Status: AC | PRN

## 2024-07-22 MED ORDER — FENTANYL CITRATE (PF) 100 MCG/2ML IJ SOLN
25.0000 ug | INTRAMUSCULAR | Status: DC | PRN
  Administered 2024-07-22: 50 ug via INTRAVENOUS

## 2024-07-22 MED ORDER — CEFAZOLIN SODIUM-DEXTROSE 2-4 GM/100ML-% IV SOLN
2.0000 g | INTRAVENOUS | Status: AC
  Administered 2024-07-22: 2 g via INTRAVENOUS

## 2024-07-22 MED ORDER — ACETAMINOPHEN 10 MG/ML IV SOLN
INTRAVENOUS | Status: AC
  Filled 2024-07-22: qty 100

## 2024-07-22 MED ORDER — PHENYLEPHRINE 80 MCG/ML (10ML) SYRINGE FOR IV PUSH (FOR BLOOD PRESSURE SUPPORT)
PREFILLED_SYRINGE | INTRAVENOUS | Status: AC
  Filled 2024-07-22: qty 10

## 2024-07-22 MED ORDER — OXYCODONE HCL 5 MG PO TABS
5.0000 mg | ORAL_TABLET | ORAL | Status: AC | PRN
  Administered 2024-07-22 – 2024-07-23 (×3): 5 mg via ORAL
  Administered 2024-07-23: 10 mg via ORAL
  Filled 2024-07-22 (×5): qty 1

## 2024-07-22 MED ORDER — METHYLERGONOVINE MALEATE 0.2 MG/ML IJ SOLN
INTRAMUSCULAR | Status: DC | PRN
  Administered 2024-07-22: .2 mg via INTRAMUSCULAR

## 2024-07-22 MED ORDER — METOCLOPRAMIDE HCL 5 MG/ML IJ SOLN
INTRAMUSCULAR | Status: AC
  Filled 2024-07-22: qty 2

## 2024-07-22 MED ORDER — DIPHENHYDRAMINE HCL 50 MG/ML IJ SOLN
12.5000 mg | INTRAMUSCULAR | Status: AC | PRN
  Administered 2024-07-22 (×2): 12.5 mg via INTRAVENOUS

## 2024-07-22 MED ORDER — ACETAMINOPHEN 325 MG PO TABS
650.0000 mg | ORAL_TABLET | ORAL | Status: AC | PRN
  Administered 2024-07-22 – 2024-07-23 (×2): 650 mg via ORAL
  Filled 2024-07-22 (×2): qty 2

## 2024-07-22 MED ORDER — WITCH HAZEL-GLYCERIN EX PADS: 1.0000 | MEDICATED_PAD | CUTANEOUS | Status: AC | PRN

## 2024-07-22 MED ORDER — ACETAMINOPHEN 10 MG/ML IV SOLN: 1000.0000 mg | Freq: Once | INTRAVENOUS | Status: DC | PRN

## 2024-07-22 MED ORDER — SCOPOLAMINE 1 MG/3DAYS TD PT72
MEDICATED_PATCH | TRANSDERMAL | Status: AC
  Filled 2024-07-22: qty 1

## 2024-07-22 MED ORDER — ONDANSETRON HCL 4 MG/2ML IJ SOLN: 4.0000 mg | Freq: Three times a day (TID) | INTRAMUSCULAR | Status: AC | PRN

## 2024-07-22 MED ORDER — POVIDONE-IODINE 10 % EX SWAB
2.0000 | Freq: Once | CUTANEOUS | Status: AC
  Administered 2024-07-22: 2 via TOPICAL

## 2024-07-22 MED ORDER — KETOROLAC TROMETHAMINE 30 MG/ML IJ SOLN
30.0000 mg | Freq: Four times a day (QID) | INTRAMUSCULAR | Status: AC
  Administered 2024-07-22 – 2024-07-23 (×4): 30 mg via INTRAVENOUS
  Filled 2024-07-22 (×4): qty 1

## 2024-07-22 MED ORDER — SIMETHICONE 80 MG PO CHEW
80.0000 mg | CHEWABLE_TABLET | Freq: Three times a day (TID) | ORAL | Status: AC
  Administered 2024-07-23 (×2): 80 mg via ORAL
  Filled 2024-07-22 (×3): qty 1

## 2024-07-22 MED ORDER — TRANEXAMIC ACID-NACL 1000-0.7 MG/100ML-% IV SOLN
INTRAVENOUS | Status: DC | PRN
  Administered 2024-07-22: 1000 mg via INTRAVENOUS

## 2024-07-22 MED ORDER — LACTATED RINGERS IV SOLN: INTRAVENOUS | Status: DC

## 2024-07-22 MED ORDER — OXYTOCIN-SODIUM CHLORIDE 30-0.9 UT/500ML-% IV SOLN
INTRAVENOUS | Status: AC
  Filled 2024-07-22: qty 500

## 2024-07-22 MED ORDER — OXYTOCIN-SODIUM CHLORIDE 30-0.9 UT/500ML-% IV SOLN
INTRAVENOUS | Status: DC | PRN
  Administered 2024-07-22: 300 mL via INTRAVENOUS

## 2024-07-22 NOTE — Anesthesia Postprocedure Evaluation (Signed)
"   Anesthesia Post Note  Patient: Autumn Stephenson  Procedure(s) Performed: CESAREAN DELIVERY     Patient location during evaluation: PACU Anesthesia Type: Spinal Level of consciousness: oriented and awake and alert Pain management: pain level controlled Vital Signs Assessment: post-procedure vital signs reviewed and stable Respiratory status: spontaneous breathing, respiratory function stable and nonlabored ventilation Cardiovascular status: blood pressure returned to baseline and stable Postop Assessment: no headache, no backache, no apparent nausea or vomiting, spinal receding and patient able to bend at knees Anesthetic complications: no   No notable events documented.  Last Vitals:  Vitals:   07/22/24 1616 07/22/24 1715  BP: 112/68 94/63  Pulse: 61 (!) 52  Resp: 18 18  Temp: (!) 36.3 C   SpO2: 100% 100%    Last Pain:  Vitals:   07/22/24 1620  TempSrc:   PainSc: 0-No pain   Pain Goal:                   [name]      "

## 2024-07-22 NOTE — Op Note (Signed)
 Cesarean Section Procedure Note   Autumn Stephenson  07/22/2024 Repeat Low transverse Cesarean section  Indications: Scheduled Proceedure/Maternal Request 39 wks  Pre-operative Diagnosis: history of cesarean section. H/o placenta accreta in 1st pregnancy  Post-operative Diagnosis: Same   Surgeon:  Barbette Knock, MD    Assistants: Alan Molt CNM  Anesthesia: spinal   Procedure Details:  The patient was seen in the Holding Room. The risks, benefits, complications, treatment options, and expected outcomes were discussed with the patient. The patient concurred with the proposed plan, giving informed consent. identified as Autumn Stephenson and the procedure verified as C-Section Delivery. A Time Out was held and the above information confirmed.  2 gm Ancef  and 1000mg  TXA given.  After induction of anesthesia, the patient was draped and prepped in the usual sterile manner, foley was draining urine well.  A pfannenstiel incision was made and carried down through the subcutaneous tissue to the fascia. Fascial incision was made and extended transversely. The fascia was separated from the underlying rectus tissue superiorly and inferiorly. The peritoneum was identified and entered. Peritoneal incision was extended longitudinally. Alexis-O retractor placed. The utero-vesical peritoneal reflection was incised transversely and the bladder flap was bluntly freed from the lower uterine segment. A low transverse uterine incision was made. Amniotic fluid clear. Delivered from cephalic presentation was a female infant with vigorous cry. Apgar scores of 8 at one minute and 9 at five minutes. Delayed cord clamping done at 1 minute and baby handed to NICU team in attendance. Cord ph was not sent. Cord blood was obtained for evaluation.  Methergine  given to aid placenta separation with prior h/o accreta. The placenta was removed Intact and appeared normal. The uterine outline, tubes and ovaries appeared normal}.  The uterine incision was closed with running locked sutures of . A second imbricating layer sutured in few areas to control bleeding.  Hemostasis was observed. Alexis retractor removed. Peritoneal closure done with 2-0 Vicryl.  The fascia was then reapproximated with running sutures of 0Vicryl. The subcuticular closure was performed using 2-0plain gut. The skin was closed with 4-0Vicryl. Steristrips, honeycomb and pressure dressing placed.  Instrument, sponge, and needle counts were correct prior the abdominal closure and were correct at the conclusion of the case.   Findings: Girl delivered from low transverse hysterotomy. Apgars 8 and 9.    Estimated Blood Loss: 375 cc    Total IV Fluids: 2500 cc LR  Urine Output: 200 cc clear in foley   Specimens: cord blood  Complications: no complications  Disposition: PACU - hemodynamically stable.   Maternal Condition: stable   Baby condition / location:  Couplet care / Skin to Skin  Attending Attestation: I performed the procedure.   Signed: Surgeon(s): Barbette Knock, MD

## 2024-07-22 NOTE — Transfer of Care (Signed)
 Immediate Anesthesia Transfer of Care Note  Patient: Autumn Stephenson  Procedure(s) Performed: CESAREAN DELIVERY  Patient Location: PACU  Anesthesia Type:Spinal  Level of Consciousness: awake, alert , and oriented  Airway & Oxygen Therapy: Patient Spontanous Breathing  Post-op Assessment: Report given to RN and Post -op Vital signs reviewed and stable  Post vital signs: Reviewed and stable  Last Vitals:  Vitals Value Taken Time  BP 107/69 07/22/24 15:05  Temp    Pulse 65 07/22/24 15:09  Resp 24 07/22/24 15:09  SpO2 94 % 07/22/24 15:09  Vitals shown include unfiled device data.  Last Pain:  Vitals:   07/22/24 1210  TempSrc: Oral         Complications: No notable events documented.

## 2024-07-23 ENCOUNTER — Encounter (HOSPITAL_COMMUNITY): Payer: Self-pay | Admitting: Obstetrics & Gynecology

## 2024-07-23 DIAGNOSIS — Z98891 History of uterine scar from previous surgery: Secondary | ICD-10-CM

## 2024-07-23 LAB — CBC
HCT: 27.9 % — ABNORMAL LOW (ref 36.0–46.0)
Hemoglobin: 9.2 g/dL — ABNORMAL LOW (ref 12.0–15.0)
MCH: 26.2 pg (ref 26.0–34.0)
MCHC: 33 g/dL (ref 30.0–36.0)
MCV: 79.5 fL — ABNORMAL LOW (ref 80.0–100.0)
Platelets: 235 10*3/uL (ref 150–400)
RBC: 3.51 MIL/uL — ABNORMAL LOW (ref 3.87–5.11)
RDW: 13.3 % (ref 11.5–15.5)
WBC: 14 10*3/uL — ABNORMAL HIGH (ref 4.0–10.5)
nRBC: 0 % (ref 0.0–0.2)

## 2024-07-23 NOTE — Lactation Note (Signed)
 This note was copied from a baby's chart. Lactation Consultation Note  Patient Name: Autumn Stephenson Today's Date: 07/23/2024 Age:33 hours, P2 , experienced breast feeder  Reason for consult: Initial assessment Term, weight loss 2 %.  Baby woke up and parents called to have a latch check.  LC reviewed breast feeding basics with hand expressing, excellent flow.  Baby opened wide and latched easily with swallows , see below.  LC reviewed 24 hour breast feeding goals - feed with cues and by 3 hours offer the breast ( 8-12 times a day )   Maternal Data Has patient been taught Hand Expression?: Yes Does the patient have breastfeeding experience prior to this delivery?: Yes How long did the patient breastfeed?: 16 months  Feeding Mother's Current Feeding Choice: Breast Milk  LATCH Score Latch: Grasps breast easily, tongue down, lips flanged, rhythmical sucking.  Audible Swallowing: Spontaneous and intermittent  Type of Nipple: Everted at rest and after stimulation  Comfort (Breast/Nipple): Soft / non-tender  Hold (Positioning): Assistance needed to correctly position infant at breast and maintain latch.  LATCH Score: 9    Interventions Interventions: Breast feeding basics reviewed;Assisted with latch;Skin to skin;Hand express;Breast compression;Adjust position;Support pillows;Position options;LC Services brochure;CDC milk storage guidelines;CDC Guidelines for Breast Pump Cleaning  Discharge Pump: Personal;DEBP;Hands Free WIC Program: No  Consult Status Consult Status: Follow-up Date: 07/24/24 Follow-up type: In-patient    Rollene Caldron Joy Haegele 07/23/2024, 9:51 AM

## 2024-07-23 NOTE — Progress Notes (Signed)
" ° °  Subjective: POD# 1 Live born female  Birth Weight: 6 lb 13.7 oz (3110 g) APGAR: 8, 9  Newborn Delivery   Birth date/time: 07/22/2024 14:11:00 Delivery type: C-Section, Low Transverse Trial of labor: No C-section categorization: Primary    Baby name: Matilda  Delivering provider: MODY, VAISHALI  Feeding: breast  Pain control at delivery: Spinal  Reports feeling well. Happy with experience.   Patient reports tolerating PO.   Pain controlled with acetaminophen  and ibuprofen  (OTC) Denies HA/SOB/C/P/N/V/dizziness. She reports vaginal bleeding as normal, without clots. She is ambulating and urinating without difficulty.     Objective:  Vitals:   07/22/24 1715 07/22/24 1815 07/22/24 1930 07/23/24 0800  BP: 94/63 109/67 (!) 93/52 (!) 90/57  Pulse: (!) 52 (!) 56 60 60  Resp: 18 18 18 16   Temp:   98.7 F (37.1 C) 98.2 F (36.8 C)  TempSrc:   Oral Oral  SpO2: 100% 100% 97% 96%  Weight:      Height:        Intake/Output Summary (Last 24 hours) at 07/23/2024 1144 Last data filed at 07/23/2024 0803 Gross per 24 hour  Intake 900 ml  Output 3074 ml  Net -2174 ml      Recent Labs    07/23/24 0540  WBC 14.0*  HGB 9.2*  HCT 27.9*  PLT 235    Blood type: --/--/A POS (02/03 1015)  Rubella: Immune (07/18 0000)   Physical Exam:  General: alert and cooperative CV: Regular rate and rhythm Resp: clear Abdomen: soft, nontender, normal bowel sounds Incision: clean, dry, and intact Uterine Fundus: firm, below umbilicus, nontender Lochia: minimal Ext: extremities normal, atraumatic, no cyanosis or edema  Assessment/Plan: 34 y.o.   POD# 1. [id number]                  Principal Problem:   Postpartum care following cesarean delivery 2/5  Encourage rest when baby rests Breastfeeding support Encourage to ambulate Routine post-op care Active Problems:   Delivery by elective cesarean section   Status post repeat low transverse cesarean section  Continue current care.  Anticipate discharge home tomorrow on POD# 2.           Alan MARLA Molt, CNM, MSN 07/23/2024, 11:44 AM    "

## 2024-07-23 NOTE — Addendum Note (Signed)
 Addendum  created 07/23/24 1105 by Tilford Franky BIRCH, MD   Attestation recorded in Intraprocedure, Intraprocedure Attestations filed
